# Patient Record
Sex: Male | Born: 1935 | Race: White | Hispanic: No | Marital: Married | Smoking: Former smoker
Health system: Southern US, Community
[De-identification: ages and names within clinical notes are randomized; demographics above are authoritative.]

## PROBLEM LIST (undated history)

## (undated) DIAGNOSIS — C801 Malignant (primary) neoplasm, unspecified: Secondary | ICD-10-CM

## (undated) DIAGNOSIS — E441 Mild protein-calorie malnutrition: Secondary | ICD-10-CM

## (undated) DIAGNOSIS — R42 Dizziness and giddiness: Secondary | ICD-10-CM

## (undated) DIAGNOSIS — C4490 Unspecified malignant neoplasm of skin, unspecified: Secondary | ICD-10-CM

## (undated) DIAGNOSIS — I10 Essential (primary) hypertension: Secondary | ICD-10-CM

## (undated) DIAGNOSIS — N139 Obstructive and reflux uropathy, unspecified: Secondary | ICD-10-CM

## (undated) DIAGNOSIS — R001 Bradycardia, unspecified: Secondary | ICD-10-CM

## (undated) DIAGNOSIS — E785 Hyperlipidemia, unspecified: Secondary | ICD-10-CM

## (undated) DIAGNOSIS — N400 Enlarged prostate without lower urinary tract symptoms: Secondary | ICD-10-CM

## (undated) DIAGNOSIS — G519 Disorder of facial nerve, unspecified: Secondary | ICD-10-CM

## (undated) DIAGNOSIS — D630 Anemia in neoplastic disease: Secondary | ICD-10-CM

## (undated) DIAGNOSIS — R1310 Dysphagia, unspecified: Principal | ICD-10-CM

## (undated) DIAGNOSIS — Z923 Personal history of irradiation: Secondary | ICD-10-CM

## (undated) DIAGNOSIS — I429 Cardiomyopathy, unspecified: Secondary | ICD-10-CM

## (undated) DIAGNOSIS — Z9889 Other specified postprocedural states: Secondary | ICD-10-CM

## (undated) DIAGNOSIS — M1990 Unspecified osteoarthritis, unspecified site: Secondary | ICD-10-CM

## (undated) DIAGNOSIS — H269 Unspecified cataract: Secondary | ICD-10-CM

## (undated) HISTORY — DX: Malignant (primary) neoplasm, unspecified: C80.1

## (undated) HISTORY — DX: Obstructive and reflux uropathy, unspecified: N13.9

## (undated) HISTORY — DX: Bradycardia, unspecified: R00.1

## (undated) HISTORY — PX: WISDOM TOOTH EXTRACTION: SHX21

## (undated) HISTORY — PX: TONSILLECTOMY: SUR1361

## (undated) HISTORY — DX: Mild protein-calorie malnutrition: E44.1

## (undated) HISTORY — PX: COLONOSCOPY: SHX174

## (undated) HISTORY — DX: Disorder of facial nerve, unspecified: G51.9

## (undated) HISTORY — DX: Dysphagia, unspecified: R13.10

## (undated) HISTORY — DX: Hyperlipidemia, unspecified: E78.5

## (undated) HISTORY — DX: Dizziness and giddiness: R42

## (undated) HISTORY — DX: Essential (primary) hypertension: I10

## (undated) HISTORY — DX: Cardiomyopathy, unspecified: I42.9

## (undated) HISTORY — DX: Benign prostatic hyperplasia without lower urinary tract symptoms: N40.0

## (undated) HISTORY — DX: Personal history of irradiation: Z92.3

## (undated) HISTORY — DX: Anemia in neoplastic disease: D63.0

## (undated) HISTORY — PX: PROSTATE BIOPSY: SHX241

## (undated) HISTORY — PX: HERNIA REPAIR: SHX51

---

## 2011-12-29 HISTORY — PX: CARDIOVASCULAR STRESS TEST: SHX262

## 2012-08-10 ENCOUNTER — Encounter (INDEPENDENT_AMBULATORY_CARE_PROVIDER_SITE_OTHER): Payer: Self-pay | Admitting: Surgery

## 2012-08-10 ENCOUNTER — Ambulatory Visit (INDEPENDENT_AMBULATORY_CARE_PROVIDER_SITE_OTHER): Payer: Medicare Other | Admitting: Surgery

## 2012-08-10 VITALS — BP 120/82 | HR 72 | Temp 97.0°F | Resp 18 | Ht 70.0 in | Wt 187.0 lb

## 2012-08-10 DIAGNOSIS — R221 Localized swelling, mass and lump, neck: Secondary | ICD-10-CM

## 2012-08-10 DIAGNOSIS — R220 Localized swelling, mass and lump, head: Secondary | ICD-10-CM

## 2012-08-10 NOTE — Patient Instructions (Signed)
Call Dr. [name] and ask for referral to an ENT doctor. If they are unable to do this, call my office in the morning and I can make these arrangements.

## 2012-08-10 NOTE — Progress Notes (Signed)
Mr. Pruss came in today with a new mass in the posterior sternocleidomastoid chain. He felt about 5 days ago basically been present for much longer. He had a CT scan of no 5 today and came in with the CD but it has no report.  I reviewed it and could see a more discrete mass on the left side. I looked in his mouth and he has good dental repair and was unable to visualize any oral mucosal lesions. I felt in his mouth but again I could this was only a limited exam. Because of his gag reflex I was unable to palpate his tonsillar pillar.  I think he would be best evaluated and managed by an ENT doctor. I asked him to call Dr. Jarold Motto in the morning and try to make that arrangement. If they're unable to hold and be glad to call someone myself.  Impression new firm nontender mass in the left posterior sternocleidomastoid chain. Recommendation: Sent to ENT for evaluation. No charge to the patient

## 2012-08-12 ENCOUNTER — Other Ambulatory Visit (HOSPITAL_COMMUNITY)
Admission: RE | Admit: 2012-08-12 | Discharge: 2012-08-12 | Disposition: A | Discharge status: Home / Self Care | Payer: Medicare Other | Source: Ambulatory Visit | Attending: Otolaryngology | Admitting: Otolaryngology

## 2012-08-12 ENCOUNTER — Other Ambulatory Visit: Payer: Self-pay | Admitting: Otolaryngology

## 2012-08-12 DIAGNOSIS — R220 Localized swelling, mass and lump, head: Secondary | ICD-10-CM | POA: Insufficient documentation

## 2012-08-15 DIAGNOSIS — C801 Malignant (primary) neoplasm, unspecified: Secondary | ICD-10-CM | POA: Insufficient documentation

## 2012-08-15 HISTORY — DX: Malignant (primary) neoplasm, unspecified: C80.1

## 2012-08-17 ENCOUNTER — Other Ambulatory Visit: Payer: Self-pay | Admitting: Otolaryngology

## 2012-08-17 DIAGNOSIS — IMO0002 Reserved for concepts with insufficient information to code with codable children: Secondary | ICD-10-CM

## 2012-08-25 ENCOUNTER — Encounter (HOSPITAL_COMMUNITY)
Admission: RE | Admit: 2012-08-25 | Discharge: 2012-08-25 | Disposition: A | Discharge status: Home / Self Care | Payer: Medicare Other | Source: Ambulatory Visit | Attending: Otolaryngology | Admitting: Otolaryngology

## 2012-08-25 ENCOUNTER — Encounter (HOSPITAL_COMMUNITY): Payer: Self-pay

## 2012-08-25 DIAGNOSIS — N433 Hydrocele, unspecified: Secondary | ICD-10-CM | POA: Insufficient documentation

## 2012-08-25 DIAGNOSIS — I700 Atherosclerosis of aorta: Secondary | ICD-10-CM | POA: Insufficient documentation

## 2012-08-25 DIAGNOSIS — I2510 Atherosclerotic heart disease of native coronary artery without angina pectoris: Secondary | ICD-10-CM | POA: Insufficient documentation

## 2012-08-25 DIAGNOSIS — K5730 Diverticulosis of large intestine without perforation or abscess without bleeding: Secondary | ICD-10-CM | POA: Insufficient documentation

## 2012-08-25 DIAGNOSIS — IMO0002 Reserved for concepts with insufficient information to code with codable children: Secondary | ICD-10-CM

## 2012-08-25 DIAGNOSIS — N289 Disorder of kidney and ureter, unspecified: Secondary | ICD-10-CM | POA: Insufficient documentation

## 2012-08-25 DIAGNOSIS — C801 Malignant (primary) neoplasm, unspecified: Secondary | ICD-10-CM | POA: Insufficient documentation

## 2012-08-25 DIAGNOSIS — N400 Enlarged prostate without lower urinary tract symptoms: Secondary | ICD-10-CM | POA: Insufficient documentation

## 2012-08-25 LAB — GLUCOSE, CAPILLARY: Glucose-Capillary: 104 mg/dL — ABNORMAL HIGH (ref 70–99)

## 2012-08-25 MED ORDER — FLUDEOXYGLUCOSE F - 18 (FDG) INJECTION
17.3000 | Freq: Once | INTRAVENOUS | Status: AC | PRN
  Administered 2012-08-25: 17.3 via INTRAVENOUS

## 2012-08-25 MED ORDER — FLUDEOXYGLUCOSE F - 18 (FDG) INJECTION: 1129.0000 | Freq: Once | INTRAVENOUS | Status: DC | PRN

## 2012-09-13 ENCOUNTER — Ambulatory Visit (HOSPITAL_COMMUNITY): Payer: Medicare Other | Attending: Cardiovascular Disease | Admitting: Radiology

## 2012-09-13 VITALS — BP 148/80 | HR 74 | Ht 70.0 in | Wt 184.0 lb

## 2012-09-13 DIAGNOSIS — I4949 Other premature depolarization: Secondary | ICD-10-CM

## 2012-09-13 DIAGNOSIS — I10 Essential (primary) hypertension: Secondary | ICD-10-CM | POA: Insufficient documentation

## 2012-09-13 DIAGNOSIS — I7090 Unspecified atherosclerosis: Secondary | ICD-10-CM

## 2012-09-13 DIAGNOSIS — R9439 Abnormal result of other cardiovascular function study: Secondary | ICD-10-CM

## 2012-09-13 DIAGNOSIS — Z01810 Encounter for preprocedural cardiovascular examination: Secondary | ICD-10-CM | POA: Insufficient documentation

## 2012-09-13 MED ORDER — TECHNETIUM TC 99M SESTAMIBI GENERIC - CARDIOLITE
10.0000 | Freq: Once | INTRAVENOUS | Status: AC | PRN
  Administered 2012-09-13: 10 via INTRAVENOUS

## 2012-09-13 MED ORDER — TECHNETIUM TC 99M SESTAMIBI GENERIC - CARDIOLITE
30.0000 | Freq: Once | INTRAVENOUS | Status: AC | PRN
  Administered 2012-09-13: 30 via INTRAVENOUS

## 2012-09-13 NOTE — Progress Notes (Signed)
[facility] SITE 3 NUCLEAR MED [address] [place] [address] [phone]  Cardiology Nuclear Med Study  Cory Serrano is a 77 y.o. male     MRN : [id number]     DOB: 1935/08/23  Procedure Date: 09/13/2012  Nuclear Med Background Indication for Stress Test:  Evaluation for Ischemia, Abnormal NM Pet Scan and  Pending Clearance for Neck Surgery by Dr. [name] History:  08/17/12 NM PET Scan:Atherosclerosis Cardiac Risk Factors: Family History - CAD, History of Smoking, Hypertension and Lipids  Symptoms:  No cardiac symptoms.   Nuclear Pre-Procedure Caffeine/Decaff Intake:  None > 12 hrs NPO After: 8:00pm   Lungs:  Clear. O2 Sat: 98% on room air. IV 0.9% NS with Angio Cath:  22g  IV Site: L Hand x 1, tolerated well IV Started by:  Irean Hong, RN  Chest Size (in):  42 Cup Size: n/a  Height: 5\' 10"  (1.778 m)  Weight:  184 lb (83.462 kg)  BMI:  Body mass index is 26.40 kg/(m^2). Tech Comments:  Atenolol held x 48 hours    Nuclear Med Study 1 or 2 day study: 1 day  Stress Test Type:  Stress  Reading MD: Charlton Haws, MD  Order Authorizing Provider:  Jarome Matin, MD, and [name], MD  Resting Radionuclide: Technetium 72m Sestamibi  Resting Radionuclide Dose: 11.0 mCi   Stress Radionuclide:  Technetium 34m Sestamibi  Stress Radionuclide Dose: 33.0 mCi           Stress Protocol Rest HR: 74 Stress HR: 141  Rest BP: 148/80 Stress BP: 226/83  Exercise Time (min): 4:45 METS: 6.7   Predicted Max HR: 146 bpm % Max HR: 96.58 bpm Rate Pressure Product: 69629   Dose of Adenosine (mg):  n/a Dose of Lexiscan: n/a mg  Dose of Atropine (mg): n/a Dose of Dobutamine: n/a mcg/kg/min (at max HR)  Stress Test Technologist: Smiley Houseman, CMA-N  Nuclear Technologist:  Domenic Polite, CNMT     Rest Procedure:  Myocardial perfusion imaging was performed at rest 45 minutes following the intravenous administration of Technetium 78m Sestamibi.  Rest ECG: No  acute changes, no previous EKG's for comparison.  Stress Procedure:  The patient exercised on the treadmill utilizing the Bruce protocol for 4:45 minutes. He then stopped due to fatigue and denied any chest pain.  There were ST-T wave changes with occasional PVC's/PAC's.  He had a hypertensive response to exercise, 226/83.  Technetium 60m Sestamibi was injected at peak exercise and myocardial perfusion imaging was performed after a brief delay.  Stress ECG: No significant change from baseline ECG  QPS Raw Data Images:  Normal; no motion artifact; normal heart/lung ratio. Stress Images:  Normal homogeneous uptake in all areas of the myocardium. Rest Images:  Normal homogeneous uptake in all areas of the myocardium. Subtraction (SDS):  Normal Transient Ischemic Dilatation (Normal <1.22):  0.92 Lung/Heart Ratio (Normal <0.45):  0.34  Quantitative Gated Spect Images QGS EDV:  62 ml QGS ESV:  20 ml  Impression Exercise Capacity:  Poor exercise capacity. BP Response:  Hypertensive blood pressure response. Clinical Symptoms:  No significant symptoms noted. ECG Impression:  No significant ST segment change suggestive of ischemia. Comparison with Prior Nuclear Study: No previous nuclear study performed  Overall Impression:  Normal stress nuclear study. Significant hypertension with exercise  LV Ejection Fraction: 68%.  LV Wall Motion:  NL LV Function; NL Wall Motion    Charlton Haws

## 2012-09-15 ENCOUNTER — Other Ambulatory Visit: Payer: Self-pay | Admitting: Otolaryngology

## 2012-09-20 ENCOUNTER — Encounter (HOSPITAL_COMMUNITY): Payer: Self-pay | Admitting: Pharmacy Technician

## 2012-09-23 ENCOUNTER — Other Ambulatory Visit: Payer: Self-pay | Admitting: Otolaryngology

## 2012-09-23 ENCOUNTER — Encounter (HOSPITAL_COMMUNITY)
Admission: RE | Admit: 2012-09-23 | Discharge: 2012-09-23 | Disposition: A | Discharge status: Home / Self Care | Payer: Medicare Other | Source: Ambulatory Visit | Attending: Otolaryngology | Admitting: Otolaryngology

## 2012-09-23 ENCOUNTER — Encounter (HOSPITAL_COMMUNITY): Payer: Self-pay

## 2012-09-23 HISTORY — DX: Unspecified osteoarthritis, unspecified site: M19.90

## 2012-09-23 LAB — BASIC METABOLIC PANEL
BUN: 16 mg/dL (ref 6–23)
CO2: 29 mEq/L (ref 19–32)
Calcium: 9.9 mg/dL (ref 8.4–10.5)
Chloride: 100 mEq/L (ref 96–112)
Creatinine, Ser: 0.85 mg/dL (ref 0.50–1.35)
Glucose, Bld: 102 mg/dL — ABNORMAL HIGH (ref 70–99)

## 2012-09-23 LAB — CBC
HCT: 40.1 % (ref 39.0–52.0)
MCH: 32.7 pg (ref 26.0–34.0)
MCHC: 35.2 g/dL (ref 30.0–36.0)
MCV: 93 fL (ref 78.0–100.0)
Platelets: 154 10*3/uL (ref 150–400)
RDW: 13.3 % (ref 11.5–15.5)
WBC: 6.4 10*3/uL (ref 4.0–10.5)

## 2012-09-23 NOTE — Pre-Procedure Instructions (Signed)
20 Brant Peets  09/23/2012   Your procedure is scheduled on:  09/29/12   Report to [facility] at 06:30 AM.  Call this number if you have problems the morning of surgery: [phone]   Remember:   Do not eat food:After Midnight.  Clear liquids include soda, tea, black coffee, apple or grape juice, broth.  Take these medicines the morning of surgery with A SIP OF WATER: ATENOLOL, stop fish oil, aspirin, and multivitamins 5 days prior to surgery    Do not wear jewelry, make-up or nail polish.  Do not wear lotions, powders, or perfumes. You may wear deodorant.  Do not shave 48 hours prior to surgery. Men may shave face and neck.  Do not bring valuables to the hospital.  Contacts, dentures or bridgework may not be worn into surgery.  Leave suitcase in the car. After surgery it may be brought to your room.  For patients admitted to the hospital, checkout time is 11:00 AM the day of discharge.   Patients discharged the day of surgery will not be allowed to drive home.  Name and phone number of your driver: N/A  Special Instructions: Shower using CHG 2 nights before surgery and the night before surgery.  If you shower the day of surgery use CHG.  Use special wash - you have one bottle of CHG for all showers.  You should use approximately 1/3 of the bottle for each shower.   Please read over the following fact sheets that you were given: Pain Booklet, Coughing and Deep Breathing and Surgical Site Infection Prevention, CHG Information sheet

## 2012-09-23 NOTE — Progress Notes (Signed)
Patient sees Dr. Jarold Motto with [facility] for primary care. Stress Test from 2013 in EPIC.

## 2012-09-23 NOTE — Progress Notes (Signed)
Called Dr. [name] office for orders. Spoke with Liz Beach, Dr. [name] nurse.

## 2012-09-26 ENCOUNTER — Encounter (HOSPITAL_COMMUNITY): Payer: Self-pay | Admitting: Vascular Surgery

## 2012-09-26 NOTE — Consult Note (Signed)
Anesthesia chart review: Patient is a 77 year old male posted for left radical neck dissection and a direct laryngoscopy for SCC by Dr. [name] on 09/29/2012.  Other history includes former smoker, hypertension, hyperlipidemia, arthritis.  PCP is Dr. Jarold Motto with [facility].    EKG on 09/23/12 showed sinus bradycardia with first-degree AV block.  He had a nuclear stress test on 09/13/12 (under Results Review tab) that showed: Normal stress nuclear study. Significant hypertension with exercise LV Ejection Fraction: 68%. LV Wall Motion: NL LV Function; NL Wall Motion.  CXR on 09/23/12 showed no active cardiopulmonary disease.   Labs noted.  Anticipate he can proceed as planned.  Shonna Chock, PA-C

## 2012-09-29 ENCOUNTER — Encounter (HOSPITAL_COMMUNITY)
Admission: RE | Disposition: A | Discharge status: Home / Self Care | Payer: Self-pay | Source: Ambulatory Visit | Attending: Otolaryngology

## 2012-09-29 ENCOUNTER — Encounter (HOSPITAL_COMMUNITY): Payer: Self-pay | Admitting: *Deleted

## 2012-09-29 ENCOUNTER — Ambulatory Visit (HOSPITAL_COMMUNITY)
Admission: RE | Admit: 2012-09-29 | Discharge: 2012-09-29 | Disposition: A | Discharge status: Home / Self Care | Payer: Medicare Other | Source: Ambulatory Visit | Attending: Otolaryngology | Admitting: Otolaryngology

## 2012-09-29 DIAGNOSIS — Z01818 Encounter for other preprocedural examination: Secondary | ICD-10-CM | POA: Insufficient documentation

## 2012-09-29 DIAGNOSIS — Z01810 Encounter for preprocedural cardiovascular examination: Secondary | ICD-10-CM | POA: Insufficient documentation

## 2012-09-29 DIAGNOSIS — I10 Essential (primary) hypertension: Secondary | ICD-10-CM | POA: Insufficient documentation

## 2012-09-29 DIAGNOSIS — Z01812 Encounter for preprocedural laboratory examination: Secondary | ICD-10-CM | POA: Insufficient documentation

## 2012-09-29 DIAGNOSIS — Z85828 Personal history of other malignant neoplasm of skin: Secondary | ICD-10-CM | POA: Insufficient documentation

## 2012-09-29 DIAGNOSIS — C770 Secondary and unspecified malignant neoplasm of lymph nodes of head, face and neck: Secondary | ICD-10-CM | POA: Insufficient documentation

## 2012-09-29 DIAGNOSIS — Z538 Procedure and treatment not carried out for other reasons: Secondary | ICD-10-CM | POA: Insufficient documentation

## 2012-09-29 SURGERY — DISSECTION, NECK, RADICAL
Anesthesia: General

## 2012-09-29 SURGICAL SUPPLY — 58 items
ATTRACTOMAT 16X20 MAGNETIC DRP (DRAPES) IMPLANT
BLADE SURG 15 STRL LF DISP TIS (BLADE) IMPLANT
BLADE SURG 15 STRL SS (BLADE)
CANISTER SUCTION 2500CC (MISCELLANEOUS) ×3 IMPLANT
CLEANER TIP ELECTROSURG 2X2 (MISCELLANEOUS) ×3 IMPLANT
CLOTH BEACON ORANGE TIMEOUT ST (SAFETY) ×3 IMPLANT
CONT SPEC 4OZ CLIKSEAL STRL BL (MISCELLANEOUS) ×3 IMPLANT
COVER SURGICAL LIGHT HANDLE (MISCELLANEOUS) ×3 IMPLANT
COVER TABLE BACK 60X90 (DRAPES) ×3 IMPLANT
CRADLE DONUT ADULT HEAD (MISCELLANEOUS) IMPLANT
DECANTER SPIKE VIAL GLASS SM (MISCELLANEOUS) ×3 IMPLANT
DRAIN CHANNEL 15F RND FF W/TCR (WOUND CARE) IMPLANT
DRAIN HEMOVAC 1/8 X 5 (WOUND CARE) IMPLANT
DRAPE PROXIMA HALF (DRAPES) ×3 IMPLANT
ELECT COATED BLADE 2.86 ST (ELECTRODE) ×3 IMPLANT
ELECT REM PT RETURN 9FT ADLT (ELECTROSURGICAL) ×2
ELECTRODE REM PT RTRN 9FT ADLT (ELECTROSURGICAL) ×2 IMPLANT
EVACUATOR SILICONE 100CC (DRAIN) ×3 IMPLANT
GAUZE SPONGE 4X4 16PLY XRAY LF (GAUZE/BANDAGES/DRESSINGS) IMPLANT
GLOVE SS BIOGEL STRL SZ 7.5 (GLOVE) ×4 IMPLANT
GLOVE SUPERSENSE BIOGEL SZ 7.5 (GLOVE) ×2
GOWN STRL NON-REIN LRG LVL3 (GOWN DISPOSABLE) ×6 IMPLANT
GUARD TEETH (MISCELLANEOUS) IMPLANT
KIT BASIN OR (CUSTOM PROCEDURE TRAY) ×3 IMPLANT
KIT ROOM TURNOVER OR (KITS) ×3 IMPLANT
LOCATOR NERVE 3 VOLT (DISPOSABLE) IMPLANT
MARKER SKIN DUAL TIP RULER LAB (MISCELLANEOUS) IMPLANT
NS IRRIG 1000ML POUR BTL (IV SOLUTION) ×3 IMPLANT
PAD ARMBOARD 7.5X6 YLW CONV (MISCELLANEOUS) ×6 IMPLANT
PATTIES SURGICAL .5 X3 (DISPOSABLE) IMPLANT
PENCIL FOOT CONTROL (ELECTRODE) ×3 IMPLANT
SOLUTION ANTI FOG 6CC (MISCELLANEOUS) IMPLANT
SPECIMEN JAR MEDIUM (MISCELLANEOUS) ×3 IMPLANT
SPECIMEN JAR SMALL (MISCELLANEOUS) ×3 IMPLANT
SPONGE GAUZE 4X4 12PLY (GAUZE/BANDAGES/DRESSINGS) ×3 IMPLANT
SPONGE INTESTINAL PEANUT (DISPOSABLE) IMPLANT
SPONGE LAP 18X18 X RAY DECT (DISPOSABLE) ×3 IMPLANT
SPONGE TONSIL 1 RF SGL (DISPOSABLE) ×3 IMPLANT
STAPLER VISISTAT 35W (STAPLE) ×3 IMPLANT
SURGILUBE 2OZ TUBE FLIPTOP (MISCELLANEOUS) ×3 IMPLANT
SUT CHROMIC 3 0 SH 27 (SUTURE) ×9 IMPLANT
SUT CHROMIC 5 0 P 3 (SUTURE) IMPLANT
SUT ETHILON 5 0 PS 2 18 (SUTURE) IMPLANT
SUT SILK 2 0 (SUTURE) ×2
SUT SILK 2 0 SH CR/8 (SUTURE) ×3 IMPLANT
SUT SILK 2-0 18XBRD TIE 12 (SUTURE) ×2 IMPLANT
SUT SILK 4 0 (SUTURE) ×8
SUT SILK 4-0 18XBRD TIE 12 (SUTURE) ×8 IMPLANT
SUT VIC AB 3-0 FS2 27 (SUTURE) IMPLANT
SUT VIC AB 3-0 SH 18 (SUTURE) IMPLANT
TOWEL OR 17X24 6PK STRL BLUE (TOWEL DISPOSABLE) ×6 IMPLANT
TOWEL OR 17X26 10 PK STRL BLUE (TOWEL DISPOSABLE) ×3 IMPLANT
TRAP SPECIMEN MUCOUS 40CC (MISCELLANEOUS) IMPLANT
TRAY ENT MC OR (CUSTOM PROCEDURE TRAY) ×3 IMPLANT
TRAY FOLEY CATH 14FRSI W/METER (CATHETERS) IMPLANT
TUBE CONNECTING 12X1/4 (SUCTIONS) ×3 IMPLANT
TUBE FEEDING 10FR FLEXIFLO (MISCELLANEOUS) IMPLANT
WATER STERILE IRR 1000ML POUR (IV SOLUTION) ×3 IMPLANT

## 2012-09-29 NOTE — Progress Notes (Signed)
Pt received from holding. Pt stated he would be calling oncologist once arrival to home.  Also, he stated he would await for Dr. [name] office to call.  Pt received no anesthesia; therefore, no anesthesia discharge instructions given.  Pt discharged to home  Via ambulation w/ wife at side.

## 2012-09-29 NOTE — Progress Notes (Signed)
Dr. [name] notified that orders need to be signed.

## 2012-09-29 NOTE — H&P (Signed)
Cory Serrano is an 77 y.o. male.   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 Discussion has been made about treatment options and he wants to proceed with a limited neck dissection. He understands that this may not address all of the lymph nodes that could be involved. He initially only wanted the node itself removed. After long discussion he is agreeable to do a very neck dissection. Radiation treatments may be necessary afterwords. He's had a long history of skin cancers and this is thought to be the source of this node.  Past Medical History  Diagnosis Date  . Hyperlipidemia   . Hypertension   . Arthritis     Past Surgical History  Procedure Date  . Tonsillectomy   . Hernia repair 8-9 years ago  . Wisdom tooth extraction   . Cardiovascular stress test 2013    copy in EPIC  . Colonoscopy     History reviewed. No pertinent family history. Social History:  reports that he quit smoking about 45 years ago. He has never used smokeless tobacco. He reports that he drinks about .6 ounces of alcohol per week. He reports that he does not use illicit drugs.  Allergies: No Known Allergies  Medications Prior to Admission  Medication Sig Dispense Refill  . amLODipine-benazepril (LOTREL) 5-20 MG per capsule Take 1 tablet by mouth Daily.      [name] aspirin 81 MG tablet Take 81 mg by mouth daily.      [name] atenolol (TENORMIN) 50 MG tablet Take 50 mg by mouth daily.      . Coenzyme Q10 (CO Q 10) 100 MG CAPS Take 100 mg by mouth 2 (two) times daily.      . hydrochlorothiazide (HYDRODIURIL) 25 MG tablet Take 12.5 mg by mouth Daily.      . Multiple Vitamins-Minerals (MULTIVITAMIN PO) Take 1 tablet by mouth daily.       . Omega-3 Fatty Acids (FISH OIL) 1200 MG CAPS Take 2 capsules by mouth 2 (two) times daily.      . simvastatin (ZOCOR) 40 MG tablet Take 40 mg by mouth every evening.         No results found for this or any  previous visit (from the past 48 hour(s)). No results found.  Review of Systems  Constitutional: Negative.   HENT: Negative.   Eyes: Negative.   Respiratory: Negative.   Cardiovascular: Negative.   Skin: Negative.     Blood pressure 173/84, pulse 57, temperature 97.4 F (36.3 C), temperature source Oral, resp. rate 18, SpO2 98.00%. Physical Exam  Constitutional: He appears well-nourished.  HENT:  Nose: Nose normal.  Mouth/Throat: Oropharynx is clear and moist.  Eyes: Pupils are equal, round, and reactive to light.  Neck: Normal range of motion.  Cardiovascular: Normal rate.   Respiratory: Effort normal.     Assessment/Plan Left neck mass-He is  ready to proceed with left neck dissection and direct laryngoscopy  Cory Serrano 09/29/2012, 7:31 AM

## 2012-09-29 NOTE — Progress Notes (Signed)
Dr,. [name] with pt. In Holding, discussion  led to  Cancelled surgery. Order rec'd fr. Dr. [name].

## 2012-10-04 ENCOUNTER — Encounter: Payer: Self-pay | Admitting: Radiation Oncology

## 2012-10-05 ENCOUNTER — Encounter: Payer: Self-pay | Admitting: Radiation Oncology

## 2012-10-05 ENCOUNTER — Encounter: Payer: Self-pay | Admitting: *Deleted

## 2012-10-05 ENCOUNTER — Ambulatory Visit
Admission: RE | Admit: 2012-10-05 | Discharge: 2012-10-05 | Disposition: A | Discharge status: Home / Self Care | Payer: Medicare Other | Source: Ambulatory Visit | Attending: Radiation Oncology | Admitting: Radiation Oncology

## 2012-10-05 VITALS — BP 147/77 | HR 60 | Temp 97.6°F | Resp 20 | Wt 186.9 lb

## 2012-10-05 DIAGNOSIS — Z79899 Other long term (current) drug therapy: Secondary | ICD-10-CM | POA: Insufficient documentation

## 2012-10-05 DIAGNOSIS — Z510 Encounter for antineoplastic radiation therapy: Secondary | ICD-10-CM | POA: Insufficient documentation

## 2012-10-05 DIAGNOSIS — Z87891 Personal history of nicotine dependence: Secondary | ICD-10-CM | POA: Insufficient documentation

## 2012-10-05 DIAGNOSIS — Z85828 Personal history of other malignant neoplasm of skin: Secondary | ICD-10-CM | POA: Insufficient documentation

## 2012-10-05 DIAGNOSIS — K117 Disturbances of salivary secretion: Secondary | ICD-10-CM | POA: Insufficient documentation

## 2012-10-05 DIAGNOSIS — Y842 Radiological procedure and radiotherapy as the cause of abnormal reaction of the patient, or of later complication, without mention of misadventure at the time of the procedure: Secondary | ICD-10-CM | POA: Insufficient documentation

## 2012-10-05 DIAGNOSIS — Z7982 Long term (current) use of aspirin: Secondary | ICD-10-CM | POA: Insufficient documentation

## 2012-10-05 DIAGNOSIS — C779 Secondary and unspecified malignant neoplasm of lymph node, unspecified: Secondary | ICD-10-CM | POA: Insufficient documentation

## 2012-10-05 DIAGNOSIS — R221 Localized swelling, mass and lump, neck: Secondary | ICD-10-CM

## 2012-10-05 DIAGNOSIS — I10 Essential (primary) hypertension: Secondary | ICD-10-CM | POA: Insufficient documentation

## 2012-10-05 DIAGNOSIS — L988 Other specified disorders of the skin and subcutaneous tissue: Secondary | ICD-10-CM | POA: Insufficient documentation

## 2012-10-05 DIAGNOSIS — R6884 Jaw pain: Secondary | ICD-10-CM | POA: Insufficient documentation

## 2012-10-05 DIAGNOSIS — R439 Unspecified disturbances of smell and taste: Secondary | ICD-10-CM | POA: Insufficient documentation

## 2012-10-05 DIAGNOSIS — J029 Acute pharyngitis, unspecified: Secondary | ICD-10-CM | POA: Insufficient documentation

## 2012-10-05 DIAGNOSIS — C801 Malignant (primary) neoplasm, unspecified: Secondary | ICD-10-CM

## 2012-10-05 DIAGNOSIS — C50919 Malignant neoplasm of unspecified site of unspecified female breast: Secondary | ICD-10-CM | POA: Insufficient documentation

## 2012-10-05 DIAGNOSIS — E785 Hyperlipidemia, unspecified: Secondary | ICD-10-CM | POA: Insufficient documentation

## 2012-10-05 HISTORY — DX: Unspecified malignant neoplasm of skin, unspecified: C44.90

## 2012-10-05 HISTORY — DX: Unspecified cataract: H26.9

## 2012-10-05 NOTE — Progress Notes (Signed)
Radiation Oncology         [phone] ________________________________  Initial outpatient Consultation  Name: Cory Serrano MRN: [id number]  Date: 10/05/2012  DOB: 06-Apr-1935  WU:[id number] Reece Agar, MD  [name], MD   REFERRING PHYSICIAN: [name], MD  DIAGNOSIS: The primary encounter diagnosis was Cancer. A diagnosis of Mass of left side of neck was also pertinent to this visit.  HISTORY OF PRESENT ILLNESS::Cory Serrano is a 77 y.o. male who is seen out of the courtesy of Dr. [name] for an opinion concerning radiation therapy as part of management of what appears to be metastatic squamous cell carcinoma of the skin.  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 According to patient he has had between 5 and 7 Moh's surgery is along the scalp region. In addition his most recent Moh's surgery was along the left preauricular area for a squamous cell carcinoma. This most recent surgery was formed in the spring of this year by Dr. Irene Limbo with [facility]. In late August of this year the patient noticed a swelling along his left upper posterior neck region.  He was seen by Dr. [name] and then Dr. Wenda Low  and Dr. [name].  The patient was actually scheduled for surgery for removal of this lymph node but actually backed out in the preoperative setting.  He wished a second opinion at that time.   PREVIOUS RADIATION THERAPY: No  PAST MEDICAL HISTORY:  has a past medical history of Hyperlipidemia; Hypertension; BPH (benign prostatic hyperplasia); Cardiomyopathy; Obstructive uropathy; Bradycardia; Cancer (08/15/12); Skin cancer; Arthritis; and Cataract.    PAST SURGICAL HISTORY: Past Surgical History  Procedure Date  . Tonsillectomy   . Hernia repair 8-9 years ago    right inguinal  . Wisdom tooth extraction   . Cardiovascular stress test 2013    copy in EPIC  . Colonoscopy   . Prostate biopsy        neg. 7-8 years ago    FAMILY HISTORY: family history includes Cancer in his mother.  SOCIAL HISTORY:  reports that he quit smoking about 45 years ago. His smoking use included Cigarettes. He has a 15 pack-year smoking history. He has never used smokeless tobacco. He reports that he drinks about .6 ounces of alcohol per week. He reports that he does not use illicit drugs.  ALLERGIES: Review of patient's allergies indicates no known allergies.  MEDICATIONS:  Current Outpatient Prescriptions  Medication Sig Dispense Refill  . amLODipine-benazepril (LOTREL) 5-20 MG per capsule Take 1 tablet by mouth Daily.      [name] aspirin 81 MG tablet Take 81 mg by mouth daily.      [name] atenolol (TENORMIN) 50 MG tablet Take 50 mg by mouth daily.      . Coenzyme Q10 (CO Q 10) 100 MG CAPS Take 100 mg by mouth 2 (two) times daily.      . hydrochlorothiazide (HYDRODIURIL) 25 MG tablet Take 12.5 mg by mouth Daily.      . Multiple Vitamins-Minerals (MULTIVITAMIN PO) Take 1 tablet by mouth daily.       . Omega-3 Fatty Acids (FISH OIL) 1200 MG CAPS Take 2 capsules by mouth 2 (two) times daily.      . simvastatin (ZOCOR) 40 MG tablet Take 40 mg by mouth every evening.         REVIEW OF SYSTEMS:  A 15 point review of systems is documented in the electronic  medical record. This was obtained by the nursing staff. However, I reviewed this with the patient to discuss relevant findings and make appropriate changes. He denies any pain with swallowing or swallowing difficulties. He denies any otalgia. He denies any pain along the left neck area where his enlarged lymph node is located. he has some numbness associated with the scalp areas where he has had multiple surgeries. He denies any numbness along the left neck or arm weakness.   PHYSICAL EXAM:  weight is 186 lb 14.4 oz (84.777 kg). His oral temperature is 97.6 F (36.4 C). His blood pressure is 147/77 and his pulse is 60. His respiration is 20.    General Appearance:     Alert, cooperative, no distress, appears stated age, somewhat nervous  Head:    Normocephalic, without obvious abnormality, atraumatic, there are multiple scars along the scalp region from prior skin cancer surgery, he has had between 5 and 7 Moh's surgeries in this area.  He also has a scar along the left preauricular area from his most recent Moh's surgery. This was in the spring of this year.   Eyes:    PERRL, conjunctiva/corneas clear, EOM's intact,        Ears:    Normal TM's and external ear canals, both ears  Nose:   Nares normal, septum midline, mucosa normal, no drainage    or sinus tenderness  Throat:   Lips, mucosa, and tongue normal; teeth and gums normal He would not permit a fiberoptic exam   Neck:   Supple, symmetrical, trachea midline, 3.5 x 3.5 cm lymph node in the left upper neck posterior to the sternocleidomastoid muscle, no other palpable adenopathy in the neck or supraclavicular region       thyroid:  No enlargement/tenderness/nodules; no carotid   bruit or JVD  Back:     Symmetric, no curvature, ROM normal, no CVA tenderness  Lungs:     Clear to auscultation bilaterally, respirations unlabored  Chest wall:    No tenderness or deformity  Heart:    Regular rate and rhythm, S1 and S2 normal, no murmur, rub   or gallop  Abdomen:     Soft, non-tender, bowel sounds active all four quadrants,    no masses, no organomegaly  Genitalia:   not done  Rectal:    Not done     Extremities:   Extremities normal, atraumatic, no cyanosis or edema  Pulses:   2+ and symmetric all extremities  Skin:   Skin color, texture, turgor normal, no rashes or lesions  Lymph nodes:   axillary nodes normal  Neurologic:   CNII-XII intact. Normal strength, sensation and reflexes      throughout    LABORATORY DATA:  Lab Results  Component Value Date   WBC 6.4 09/23/2012   HGB 14.1 09/23/2012   HCT 40.1 09/23/2012   MCV 93.0 09/23/2012   PLT 154 09/23/2012   Lab Results  Component Value Date   NA  137 09/23/2012   K 4.5 09/23/2012   CL 100 09/23/2012   CO2 29 09/23/2012   No results found for this basename: ALT, AST, GGT, ALKPHOS, BILITOT  PATHOLOGY  Patient Name: RUPERTO, KIERNAN Accession #: [id number] DOB: 1935-11-17 Age: 18 Gender: M Client Name [facility] Collected Date: 08/12/2012 Received Date: 08/15/2012 Physician: [name], MD Chart #: MRN # : [id number] Physician cc: Race:W Visit #: CYTOPATHOLOGY REPORT Adequacy Reason Satisfactory For Evaluation. Diagnosis NECK, FINE NEEDLE ASPIRATION, LEFT:  MALIGNANT CELLS PRESENT CONSISENT WITH SQUAMOUS CELL CARCINOMA. COMMENT: DR. Raynald Blend HAS REVIEWED THIS CASE AND AGREES. CALLED TO DR. BYERS' OFFICE ON 08/16/12. Jimmy Picket MD Pathologist, Electronic Signature (Case signed 08/16/2012) Source Neck, Fine Needle Aspiration, Left Gross Specimen: Received is/are 30cc's of cloudy, white cytolyt solution and 2 air dried slides for Diff stain.(PH:gw) Prepared: # Smears: 2 # Concentration Technique Slides (i.e. ThinPrep): 1 # Cell Block: 1 Cellient Additional Studies: N/A Report signed out from the following location(s) [facility] [address] [place], [place], [place] [address]. CLIA #: [id number], [facility] [address], [place], [place] [address]. CLIA #: [id number], 1 of 1   RADIOGRAPHY: Dg Chest 2 View  09/23/2012  *RADIOLOGY REPORT*  Clinical Data: Preop neck dissection.  Hypertension.  CHEST - 2 VIEW  Comparison: None.  Findings: Heart and mediastinal contours are within normal limits. No focal opacities or effusions.  No acute bony abnormality.  IMPRESSION: No active cardiopulmonary disease.   Original Report Authenticated By: Cyndie Chime, M.D.    PET SCAN:  IMPRESSION:  1. There is a solitary hypermetabolic level V-a lymph node on the  left in the neck, concerning for malignant nodal metastasis.  2. There is an additional hypermetabolic focus immediately    posterior to the mandible on the left, presumably secondary to  dental disease.  3. No other findings to suggest metastatic disease in the chest,  abdomen or pelvis.  4. Colonic diverticulosis without findings to suggest acute  diverticulitis at this time.  5. Atherosclerosis, including left main and two-vessel coronary  artery disease.  6. Additional incidental findings, as above.     IMPRESSION: Probable metastatic squamous cell carcinoma of the skin to the left posterior neck region. I would recommend however  that the patient undergo a thorough head and neck exam through Dr. [name] office to rule out a head and neck primary. I discussed options for management of his metastatic squamous cell carcinoma including neck dissection, definitive radiation therapy and combined treatment with surgery and postoperative radiation treatments. Given the size of the lesion my first preference would be for the patient to proceed with surgery and then to consider postop radiation treatments. Patient is somewhat hesitant in considering surgery at this time but will discuss with his wife and let me or Dr. [name] know on October 14 or 15th his final decision.  If the patient does proceed with radiation therapy I have recommended he proceed with pre-radiation dental evaluation.   ------------------------------------------------   [name], PhD, MD

## 2012-10-05 NOTE — Progress Notes (Signed)
Please see the Nurse Progress Note in the MD Initial Consult Encounter for this patient. 

## 2012-10-05 NOTE — Progress Notes (Signed)
New Consult 08/15/12 Biopsy Left neck=Squamous Cell Carcinoma  Married, adopted  Daughter, alert,oriented x3, no c/o pain, 8-10 squamous cell carcinoma and basal cells on top of forehead, left neck knot mass    Allergies: NKDA

## 2012-10-07 NOTE — Addendum Note (Signed)
Encounter addended by: Delynn Flavin, RN on: 10/07/2012  6:20 PM<BR>     Documentation filed: Charges VN

## 2012-10-13 ENCOUNTER — Ambulatory Visit (HOSPITAL_COMMUNITY): Payer: Self-pay | Admitting: Dentistry

## 2012-10-13 ENCOUNTER — Encounter (HOSPITAL_COMMUNITY): Payer: Self-pay | Admitting: Dentistry

## 2012-10-13 VITALS — BP 130/71 | HR 58 | Temp 98.0°F

## 2012-10-13 DIAGNOSIS — K036 Deposits [accretions] on teeth: Secondary | ICD-10-CM

## 2012-10-13 DIAGNOSIS — K011 Impacted teeth: Secondary | ICD-10-CM

## 2012-10-13 DIAGNOSIS — M264 Malocclusion, unspecified: Secondary | ICD-10-CM

## 2012-10-13 DIAGNOSIS — C760 Malignant neoplasm of head, face and neck: Secondary | ICD-10-CM

## 2012-10-13 DIAGNOSIS — C801 Malignant (primary) neoplasm, unspecified: Secondary | ICD-10-CM

## 2012-10-13 DIAGNOSIS — K0530 Chronic periodontitis, unspecified: Secondary | ICD-10-CM

## 2012-10-13 DIAGNOSIS — K089 Disorder of teeth and supporting structures, unspecified: Secondary | ICD-10-CM

## 2012-10-13 DIAGNOSIS — M270 Developmental disorders of jaws: Secondary | ICD-10-CM

## 2012-10-13 DIAGNOSIS — Z0189 Encounter for other specified special examinations: Secondary | ICD-10-CM

## 2012-10-13 DIAGNOSIS — J392 Other diseases of pharynx: Secondary | ICD-10-CM

## 2012-10-13 MED ORDER — SODIUM FLUORIDE 1.1 % DT CREA: TOPICAL_CREAM | DENTAL | Status: DC

## 2012-10-13 NOTE — Progress Notes (Signed)
DENTAL CONSULTATION  Date of Consultation:  10/13/2012 Patient Name:   Cory Serrano Date of Birth:   [birth year] Medical Record Number: [id number]  VITALS: BP 130/71  Pulse 58  Temp 98 F (36.7 C) (Oral)   CHIEF COMPLAINT: Patient referred for a preradiation therapy dental protocol evaluation.  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 Patient with anticipated radiation therapy. Patient now seen as part of a preradiation therapy dental protocol evaluation.  Patient currently denies acute toothache, swellings, or abscesses. Patient is seen on an every 6 month basis. Patient sees Dr. Domingo Dimes as his primary dentist.  He was last seen in May of 2013 for an exam and cleaning.  Patient Active Problem List  Diagnosis  . Mass of left side of neck  . Cancer    PMH: Past Medical History  Diagnosis Date  . Hyperlipidemia   . Hypertension   . BPH (benign prostatic hyperplasia)   . Cardiomyopathy   . Obstructive uropathy   . Bradycardia   . 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 Arthritis     hands per patient  . Cataract     beginning of    PSH: Past Surgical History  Procedure Date  . Tonsillectomy   . Hernia repair 8-9 years ago    right inguinal  . Wisdom tooth extraction   . Cardiovascular stress test 2013    copy in [facility]  . Colonoscopy   . Prostate biopsy      neg. 7-8 years ago    ALLERGIES: No Known Allergies  MEDICATIONS: Current Outpatient Prescriptions  Medication Sig Dispense Refill  . amLODipine-benazepril (LOTREL) 5-20 MG per capsule Take 1 tablet by mouth Daily.      [name] aspirin 81 MG tablet Take 81 mg by mouth daily.      [name] atenolol (TENORMIN) 50 MG tablet Take 50 mg by mouth daily.      . Coenzyme Q10 (CO Q 10) 100 MG CAPS Take 100 mg by mouth 2 (two) times daily.      . hydrochlorothiazide  (HYDRODIURIL) 25 MG tablet Take 12.5 mg by mouth Daily.      . Multiple Vitamins-Minerals (MULTIVITAMIN PO) Take 1 tablet by mouth daily.       . Omega-3 Fatty Acids (FISH OIL) 1200 MG CAPS Take 2 capsules by mouth 2 (two) times daily.      . simvastatin (ZOCOR) 40 MG tablet Take 40 mg by mouth every evening.       . sodium fluoride (PREVIDENT 5000 PLUS) 1.1 % CREA dental cream Apply thin ribbon of cream to tooth brush. Brush teeth for 2 minutes. Spit out excess-DO NOT swallow. DO NOT rinse afterwards. Repeat nightly.  1 Tube  PRN    LABS: Lab Results  Component Value Date   WBC 6.4 09/23/2012   HGB 14.1 09/23/2012   HCT 40.1 09/23/2012   MCV 93.0 09/23/2012   PLT 154 09/23/2012      Component Value Date/Time   NA 137 09/23/2012 1139   K 4.5 09/23/2012 1139   CL 100 09/23/2012 1139   CO2 29 09/23/2012 1139   GLUCOSE 102* 09/23/2012 1139   BUN 16 09/23/2012 1139   CREATININE 0.85 09/23/2012 1139   CALCIUM 9.9 09/23/2012 1139   GFRNONAA 84* 09/23/2012 1139  GFRAA >90 09/23/2012 1139   No results found for this basename: INR, PROTIME   No results found for this basename: PTT    SOCIAL HISTORY: History   Social History  . Marital Status: Married    Spouse Name: N/A    Number of Children: 1  . Years of Education: N/A   Occupational History  .    .      investment financial   Social History Main Topics  . Smoking status: Former Smoker -- 1.5 packs/day for 10 years    Types: Cigarettes    Quit date: 12/28/1966  . Smokeless tobacco: Never Used  . Alcohol Use: 0.6 oz/week    1 Shots of liquor per week     0.6 ounces week  . Drug Use: No  . Sexually Active: Not on file   Other Topics Concern  . Not on file   Social History Narrative  . No narrative on file    FAMILY HISTORY: Family History  Problem Relation Age of Onset  . Cancer Mother     colon     REVIEW OF SYSTEMS: Reviewed with patient and included in dental chart.  DENTAL HISTORY: CHIEF COMPLAINT: Patient  referred for a preradiation therapy dental protocol evaluation.  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 Patient with anticipated radiation therapy. Patient now seen as part of a preradiation therapy dental protocol evaluation.  Patient currently denies acute toothache, swellings, or abscesses. Patient is seen on an every 6 month basis. Patient sees Dr. Domingo Dimes as his primary dentist.  He was last seen in May of 2013 for an exam and cleaning.   DENTAL EXAMINATION:  GENERAL:. Patient is a well-developed, well-nourished male in no acute distress. HEAD AND NECK: There is left neck lymphadenopathy consistent with cancer diagnosis. There is no right neck lymphadenopathy. The patient denies acute TMJ symptoms. INTRAORAL EXAM: Patient has normal saliva. Patient has a mandibular right lingual torus. DENTITION: The patient is missing tooth numbers 16 and 32. Tooth #17 is a full bony impaction. PERIODONTAL: Patient with chronic periodontitis with minimal plaque accumulations, selective areas of gingival recession, and no obvious tooth mobility. There is incipient to moderate bone loss noted. DENTAL CARIES/SUBOPTIMAL RESTORATIONS: The distal margin of tooth #31 has an overhang. Multiple abfraction lesions are noted and could be restored with composite resin as indicated by primary dentist. ENDODONTIC: Patient currently denies acute pulpitis symptoms. Patient has had a root canal therapy of tooth #10 with no obvious persistent periapical pathology or symptoms. CROWN AND BRIDGE: The patient has multiple crown restorations.  The margins on crown #18 are less than ideal. PROSTHODONTIC: No history of partial dentures.. OCCLUSION: Patient has a deep overbite and less than ideal occlusion. The occlusion is stable however.  RADIOGRAPHIC INTERPRETATION: An orthopantogram was taken and supplemented with a full series of dental  radiographs. These are suboptimal due to the gagging nature of the patient. Patient is missing tooth numbers 16 and 32. Tooth #17 is a full bony impaction. There multiple dental restorations noted. There is a distal overhang on the restoration of tooth #31. There is incipient to moderate bone loss. Tooth #10 has a root canal therapy with no obvious persistent periapical pathology.  ASSESSMENTS: 1. Chronic periodontitis with plaque accumulations 2. Incipient bone loss 3. No significant tooth mobility 4. mandibular  right torus 5. Multiple missing teeth 6. Impacted tooth #17 7. Deep overbite 8. Multiple abfraction  lesions 9. Poor occlusal scheme but stable occlusion 10. Severe gag reflex   PLAN/RECOMMENDATIONS: 1. I discussed the risks, benefits, and complications of various treatment options with the patient in relationship to his medical and dental conditions, anticipated radiation therapy, and radiation therapy side effects to include xerostomia, radiation caries ,mucositis, taste changes, gum and jawbone changes, and risk for infection and osteoradionecrosis. We discussed various treatment options to include no treatment, multiple extractions with alveoloplasty of teeth in the primary field ration therapy, pre-prosthetic surgery as indicated, periodontal therapy, dental restorations, root canal therapy, crown and bridge therapy, implant therapy, and replacement of missing teeth as indicated. We also discussed fabrication of fluoride trays and scatter protection devices. The patient does not wish to proceed with dental extractions at this time and understands potential risk for osteoradionecrosis in the future. I also attempted impressions for the fabrication of fluoride trays and scatter protection devices. Patient has a severe gagging reflex and we were unable to obtain impressions today. Patient agreed to use fluoride therapy the the PreviDent 5000 Plus instead of fabrication of fluoride trays  at this time. Scatter protection devices therefore will not be fabricated for this patient. The patient was encouraged to set up a dental cleaning appointment with Dr. Providence Lanius prior to the start of radiation therapy and I will assist in coordination of this is best able.   2. Discussion of findings with medical team and coordination of future medical and dental care as indicated  .  Charlynne Pander, DDS

## 2012-10-13 NOTE — Patient Instructions (Addendum)

## 2012-10-18 ENCOUNTER — Ambulatory Visit
Admission: RE | Admit: 2012-10-18 | Discharge: 2012-10-18 | Disposition: A | Discharge status: Home / Self Care | Payer: Medicare Other | Source: Ambulatory Visit | Attending: Radiation Oncology | Admitting: Radiation Oncology

## 2012-10-18 ENCOUNTER — Ambulatory Visit: Admission: RE | Admit: 2012-10-18 | Payer: Medicare Other | Source: Ambulatory Visit

## 2012-10-18 VITALS — Wt 181.0 lb

## 2012-10-18 DIAGNOSIS — R221 Localized swelling, mass and lump, neck: Secondary | ICD-10-CM

## 2012-10-18 DIAGNOSIS — C801 Malignant (primary) neoplasm, unspecified: Secondary | ICD-10-CM

## 2012-10-18 DIAGNOSIS — C760 Malignant neoplasm of head, face and neck: Secondary | ICD-10-CM | POA: Insufficient documentation

## 2012-10-18 MED ORDER — SODIUM CHLORIDE 0.9 % IJ SOLN
10.0000 mL | Freq: Once | INTRAMUSCULAR | Status: AC
  Administered 2012-10-18: 10 mL via INTRAVENOUS

## 2012-10-18 NOTE — Progress Notes (Signed)
Patient gave dob and name as identification, not diabetic,not allergic to sulfa,iv dye stated patient, started IV LAC , #22g x 1 attempt, excellent blood return, flushed with 10cc normal saline,. BUN/CR  verified   By [facility] Malloy/RN,  Arlys John therapist escorted patient to Ct sim  3:01 PM

## 2012-10-19 NOTE — Progress Notes (Signed)
IV catheter removed from left arm antecubital area intact with no bleeding.Reviewed patient calendar, and left voice mail for Delice Bison Barrister's clerk counselor to reschedule appointment to see patient first week of treatment.

## 2012-10-19 NOTE — Progress Notes (Signed)
  Radiation Oncology         [phone] ________________________________  Name: Cory Serrano MRN: [id number]  Date: 10/18/2012  DOB: Feb 06, 1935  SIMULATION AND TREATMENT PLANNING NOTE  DIAGNOSIS:  Metastatic squamous cell carcinoma to the left neck  NARRATIVE:  The patient was brought to the CT Simulation planning suite.  Identity was confirmed.  All relevant records and images related to the planned course of therapy were reviewed.  The patient freely provided informed written consent to proceed with treatment after reviewing the details related to the planned course of therapy. The consent form was witnessed and verified by the simulation staff.  Then, the patient was set-up in a stable reproducible  supine position for radiation therapy.  CT images were obtained.  Surface markings were placed.  The CT images were loaded into the planning software.  Then the target and avoidance structures were contoured.  Treatment planning then occurred.  The radiation prescription was entered and confirmed.  A total of 1 complex treatment devices were fabricated. I have requested : Intensity Modulated Radiotherapy (IMRT) is medically necessary for this case for the following reason:  Parotid sparing..  I have ordered:dose calc.  PLAN:  The patient will receive 7000 cGy in 35 fractions.  ________________________________    [name], PhD, MD

## 2012-10-27 ENCOUNTER — Ambulatory Visit
Admission: RE | Admit: 2012-10-27 | Discharge: 2012-10-27 | Disposition: A | Discharge status: Home / Self Care | Payer: Medicare Other | Source: Ambulatory Visit | Attending: Radiation Oncology | Admitting: Radiation Oncology

## 2012-10-27 ENCOUNTER — Telehealth: Payer: Self-pay | Admitting: Radiation Oncology

## 2012-10-27 ENCOUNTER — Encounter: Payer: Self-pay | Admitting: Radiation Oncology

## 2012-10-27 DIAGNOSIS — C801 Malignant (primary) neoplasm, unspecified: Secondary | ICD-10-CM

## 2012-10-27 NOTE — Telephone Encounter (Signed)
Met with patient to discuss RO billing.  Dx: 784.2 Swelling, mass or lump in head and neck  Attending Rad: Dr. Roselind Messier  Rad Tx: IMRT x40

## 2012-10-27 NOTE — Progress Notes (Signed)
   Department of Radiation Oncology  Phone:  [phone] Fax:        [phone]   Intensity modulated radiation therapy treatment device note  Today the patient began treatment with helical intensity modulated radiation therapy.  The patient will be treated with 7.6 sinogram segments. This constitutes 1 IMRT device.  -----------------------------------  [name], PhD, MD

## 2012-10-28 ENCOUNTER — Ambulatory Visit
Admission: RE | Admit: 2012-10-28 | Discharge: 2012-10-28 | Disposition: A | Discharge status: Home / Self Care | Payer: Medicare Other | Source: Ambulatory Visit | Attending: Radiation Oncology | Admitting: Radiation Oncology

## 2012-10-31 ENCOUNTER — Ambulatory Visit
Admission: RE | Admit: 2012-10-31 | Discharge: 2012-10-31 | Disposition: A | Discharge status: Home / Self Care | Payer: Medicare Other | Source: Ambulatory Visit | Attending: Radiation Oncology | Admitting: Radiation Oncology

## 2012-11-01 ENCOUNTER — Ambulatory Visit
Admission: RE | Admit: 2012-11-01 | Discharge: 2012-11-01 | Disposition: A | Discharge status: Home / Self Care | Payer: Medicare Other | Source: Ambulatory Visit | Attending: Radiation Oncology | Admitting: Radiation Oncology

## 2012-11-01 VITALS — BP 149/89 | HR 68 | Temp 98.2°F | Wt 187.9 lb

## 2012-11-01 DIAGNOSIS — C801 Malignant (primary) neoplasm, unspecified: Secondary | ICD-10-CM

## 2012-11-01 DIAGNOSIS — R221 Localized swelling, mass and lump, neck: Secondary | ICD-10-CM

## 2012-11-01 MED ORDER — BIAFINE EX EMUL
CUTANEOUS | Status: DC | PRN
  Administered 2012-11-01: 1 via TOPICAL

## 2012-11-01 NOTE — Progress Notes (Signed)
[facility]    Radiation Oncology [address] [place]     [name], M.D. [place], [place] [address]               [name], M.D., Ph.D. Phone: [phone]      Molli Hazard A. Kathrynn Running, M.D. Fax: [phone]      Radene Gunning, M.D., Ph.D.         [name], M.D.         [name], M.D Weekly Treatment Management Note  Name: Cory Serrano     MRN: [id number]        CSN: [id number] Date: 11/01/2012      DOB: 1935-06-15  CC: Cory Fillers, MD         Cory Serrano    Status: Outpatient  Diagnosis: The encounter diagnosis was Cancer.  Current Dose: 800 cGy  Current Fraction: 4/35  Planned Dose: 7000 cGy  Narrative: Cory Serrano was seen today for weekly treatment management. The chart was checked and MVCT  were reviewed. He is tolerating his treatments well at this time without any side effects. He has had some problems with opening his jaw related to pain in the right jaw area. This is been a chronic intermittent problem for the patient.  Review of patient's allergies indicates no known allergies. Current Outpatient Prescriptions  Medication Sig Dispense Refill  . amLODipine-benazepril (LOTREL) 5-20 MG per capsule Take 1 tablet by mouth Daily.      [name] aspirin 81 MG tablet Take 81 mg by mouth daily.      [name] atenolol (TENORMIN) 50 MG tablet Take 50 mg by mouth daily.      . Coenzyme Q10 (CO Q 10) 100 MG CAPS Take 100 mg by mouth 2 (two) times daily.      . hydrochlorothiazide (HYDRODIURIL) 25 MG tablet Take 12.5 mg by mouth Daily.      . Multiple Vitamins-Minerals (MULTIVITAMIN PO) Take 1 tablet by mouth daily.       . Omega-3 Fatty Acids (FISH OIL) 1200 MG CAPS Take 2 capsules by mouth 2 (two) times daily.      . simvastatin (ZOCOR) 40 MG tablet Take 40 mg by mouth every evening.       . sodium fluoride (PREVIDENT 5000 PLUS) 1.1 % CREA dental cream Apply thin ribbon of cream to tooth brush. Brush teeth for 2 minutes. Spit out excess-DO NOT  swallow. DO NOT rinse afterwards. Repeat nightly.  1 Tube  PRN   Labs:  Lab Results  Component Value Date   WBC 6.4 09/23/2012   HGB 14.1 09/23/2012   HCT 40.1 09/23/2012   MCV 93.0 09/23/2012   PLT 154 09/23/2012   Lab Results  Component Value Date   CREATININE 0.85 09/23/2012   BUN 16 09/23/2012   NA 137 09/23/2012   K 4.5 09/23/2012   CL 100 09/23/2012   CO2 29 09/23/2012   No results found for this basename: ALT, AST, GGT, ALK, PHOS, BILITOT    Physical Examination:  weight is 187 lb 14.4 oz (85.231 kg). His temperature is 98.2 F (36.8 C). His blood pressure is 149/89 and his pulse is 68.    Wt Readings from Last 3 Encounters:  11/01/12 187 lb 14.4 oz (85.231 kg)  10/18/12 181 lb (82.101 kg)  10/05/12 186 lb 14.4 oz (84.777 kg)     Lungs - Normal respiratory effort, chest expands symmetrically. Lungs are clear to auscultation, no crackles or wheezes.  Heart has regular rhythm and rate  Abdomen is soft and non tender with normal bowel sounds The oral cavity is free of any infection or mucosal inflammation.  The patient's lymph node in the left posterior upper neck area measures 4 x 4 centimeters.  Assessment:  Patient tolerating treatments well  Plan: Continue treatment per original radiation prescription

## 2012-11-01 NOTE — Addendum Note (Signed)
Encounter addended by: Tessa Lerner, RN on: 11/01/2012  4:59 PM<BR>     Documentation filed: Inpatient MAR

## 2012-11-01 NOTE — Progress Notes (Signed)
Patient here for weekly under treat assessment of left neck radiation.Will complete treatment 2 today after seeing doctor.Patient education completed.Given biafine.Patient's main problem today is having pain on right side of face on opening mouth.Reviewed mouth care, skin care and how to take care of self to off set fatigue, also reviewed number of treatments, daily dose and cumulative dose of radiation.Understands he will be seen generally on Tuesday but on call doctor available if needed.

## 2012-11-02 ENCOUNTER — Ambulatory Visit
Admission: RE | Admit: 2012-11-02 | Discharge: 2012-11-02 | Disposition: A | Discharge status: Home / Self Care | Payer: Medicare Other | Source: Ambulatory Visit | Attending: Radiation Oncology | Admitting: Radiation Oncology

## 2012-11-03 ENCOUNTER — Ambulatory Visit
Admission: RE | Admit: 2012-11-03 | Discharge: 2012-11-03 | Disposition: A | Discharge status: Home / Self Care | Payer: Medicare Other | Source: Ambulatory Visit | Attending: Radiation Oncology | Admitting: Radiation Oncology

## 2012-11-04 ENCOUNTER — Ambulatory Visit
Admission: RE | Admit: 2012-11-04 | Discharge: 2012-11-04 | Disposition: A | Discharge status: Home / Self Care | Payer: Medicare Other | Source: Ambulatory Visit | Attending: Radiation Oncology | Admitting: Radiation Oncology

## 2012-11-07 ENCOUNTER — Ambulatory Visit
Admission: RE | Admit: 2012-11-07 | Discharge: 2012-11-07 | Disposition: A | Discharge status: Home / Self Care | Payer: Medicare Other | Source: Ambulatory Visit | Attending: Radiation Oncology | Admitting: Radiation Oncology

## 2012-11-08 ENCOUNTER — Ambulatory Visit
Admission: RE | Admit: 2012-11-08 | Discharge: 2012-11-08 | Disposition: A | Discharge status: Home / Self Care | Payer: Medicare Other | Source: Ambulatory Visit | Attending: Radiation Oncology | Admitting: Radiation Oncology

## 2012-11-08 VITALS — BP 143/78 | HR 54 | Temp 97.9°F | Wt 187.5 lb

## 2012-11-08 DIAGNOSIS — C801 Malignant (primary) neoplasm, unspecified: Secondary | ICD-10-CM

## 2012-11-08 NOTE — Progress Notes (Signed)
[facility]    Radiation Oncology [address] [place]     [name], M.D. [place], [place] [address]               [name], M.D., Ph.D. Phone: [phone]      Molli Hazard A. Kathrynn Running, M.D. Fax: [phone]      Radene Gunning, M.D., Ph.D.         [name], M.D.         [name], M.D Weekly Treatment Management Note  Name: Cory Serrano     MRN: [id number]        CSN: [id number] Date: 11/08/2012      DOB: April 14, 1935  CC: [name], MD         [name]    Status: Outpatient  Diagnosis: The encounter diagnosis was Cancer.  Current Dose: 1800 cgy  Current Fraction: 9/35  Planned Dose: 7000 cGy  Narrative: Cory Serrano was seen today for weekly treatment management. The chart was checked and MVCT  were reviewed. He has noticed some slight dryness to his mouth but no taste issues or swallowing difficulties.  He continues to have difficulty opening his mouth in light of right jaw pain.    Review of patient's allergies indicates no known allergies.  Current Outpatient Prescriptions  Medication Sig Dispense Refill  . amLODipine-benazepril (LOTREL) 5-20 MG per capsule Take 1 tablet by mouth Daily.      [name] aspirin 81 MG tablet Take 81 mg by mouth daily.      [name] atenolol (TENORMIN) 50 MG tablet Take 50 mg by mouth daily.      . Coenzyme Q10 (CO Q 10) 100 MG CAPS Take 100 mg by mouth 2 (two) times daily.      . hydrochlorothiazide (HYDRODIURIL) 25 MG tablet Take 12.5 mg by mouth Daily.      . Multiple Vitamins-Minerals (MULTIVITAMIN PO) Take 1 tablet by mouth daily.       . Omega-3 Fatty Acids (FISH OIL) 1200 MG CAPS Take 2 capsules by mouth 2 (two) times daily.      . simvastatin (ZOCOR) 40 MG tablet Take 40 mg by mouth every evening.       . sodium fluoride (PREVIDENT 5000 PLUS) 1.1 % CREA dental cream Apply thin ribbon of cream to tooth brush. Brush teeth for 2 minutes. Spit out excess-DO NOT swallow. DO NOT rinse afterwards. Repeat  nightly.  1 Tube  PRN   Labs:  Lab Results  Component Value Date   WBC 6.4 09/23/2012   HGB 14.1 09/23/2012   HCT 40.1 09/23/2012   MCV 93.0 09/23/2012   PLT 154 09/23/2012   Lab Results  Component Value Date   CREATININE 0.85 09/23/2012   BUN 16 09/23/2012   NA 137 09/23/2012   K 4.5 09/23/2012   CL 100 09/23/2012   CO2 29 09/23/2012   No results found for this basename: ALT, AST, GGT, ALK, PHOS, BILITOT    Physical Examination:  weight is 187 lb 8 oz (85.049 kg). His temperature is 97.9 F (36.6 C). His blood pressure is 143/78 and his pulse is 54.    Wt Readings from Last 3 Encounters:  11/08/12 187 lb 8 oz (85.049 kg)  11/01/12 187 lb 14.4 oz (85.231 kg)  10/18/12 181 lb (82.101 kg)     Lungs - Normal respiratory effort, chest expands symmetrically. Lungs are clear to auscultation, no crackles or wheezes.  Heart has regular rhythm and  rate  Abdomen is soft and non tender with normal bowel sounds The oral cavity is moist without secondary infection.  The lymph node mass in the left upper posterior neck area measures approximately 3.5 x 3.5 cm.  Assessment:  Patient tolerating treatments well  Plan: Continue treatment per original radiation prescription

## 2012-11-08 NOTE — Progress Notes (Signed)
Patient here for weekly under treat assessment of left neck cancer.Completed 9 of 35 treatments.Denies pain.Slight dry mouth.Uses biotene.

## 2012-11-09 ENCOUNTER — Ambulatory Visit
Admission: RE | Admit: 2012-11-09 | Discharge: 2012-11-09 | Disposition: A | Discharge status: Home / Self Care | Payer: Medicare Other | Source: Ambulatory Visit | Attending: Radiation Oncology | Admitting: Radiation Oncology

## 2012-11-09 MED ORDER — SODIUM CHLORIDE 0.9 % IJ SOLN: 10.0000 mL | Freq: Once | INTRAMUSCULAR | Status: AC

## 2012-11-09 MED ORDER — SODIUM CHLORIDE 0.9 % IJ SOLN: 10.0000 mL | Freq: Once | INTRAMUSCULAR | Status: DC

## 2012-11-09 NOTE — Addendum Note (Signed)
Encounter addended by: Delynn Flavin, RN on: 11/09/2012  6:58 PM<BR>     Documentation filed: Visit Diagnoses, Charges VN, Orders

## 2012-11-09 NOTE — Addendum Note (Signed)
Encounter addended by: Delynn Flavin, RN on: 11/09/2012  7:15 PM<BR>     Documentation filed: Demographics Visit

## 2012-11-09 NOTE — Addendum Note (Signed)
Encounter addended by: Delynn Flavin, RN on: 11/09/2012  7:03 PM<BR>     Documentation filed: Orders

## 2012-11-10 ENCOUNTER — Ambulatory Visit
Admission: RE | Admit: 2012-11-10 | Discharge: 2012-11-10 | Disposition: A | Discharge status: Home / Self Care | Payer: Medicare Other | Source: Ambulatory Visit | Attending: Radiation Oncology | Admitting: Radiation Oncology

## 2012-11-11 ENCOUNTER — Ambulatory Visit
Admission: RE | Admit: 2012-11-11 | Discharge: 2012-11-11 | Disposition: A | Discharge status: Home / Self Care | Payer: Medicare Other | Source: Ambulatory Visit | Attending: Radiation Oncology | Admitting: Radiation Oncology

## 2012-11-14 ENCOUNTER — Ambulatory Visit
Admission: RE | Admit: 2012-11-14 | Discharge: 2012-11-14 | Disposition: A | Discharge status: Home / Self Care | Payer: Medicare Other | Source: Ambulatory Visit | Attending: Radiation Oncology | Admitting: Radiation Oncology

## 2012-11-15 ENCOUNTER — Ambulatory Visit
Admission: RE | Admit: 2012-11-15 | Discharge: 2012-11-15 | Disposition: A | Discharge status: Home / Self Care | Payer: Medicare Other | Source: Ambulatory Visit | Attending: Radiation Oncology | Admitting: Radiation Oncology

## 2012-11-15 VITALS — BP 147/84 | HR 51 | Temp 98.6°F | Wt 186.7 lb

## 2012-11-15 DIAGNOSIS — C801 Malignant (primary) neoplasm, unspecified: Secondary | ICD-10-CM

## 2012-11-15 NOTE — Progress Notes (Signed)
Patient here for weekly under treat assessment of left neck radiation.Has completed 14 of 35 treatments.Skin mildly pink.Dry mouth and metallic taste but able to tolerate soft diet.No significant weight change.

## 2012-11-15 NOTE — Progress Notes (Signed)
[facility]    Radiation Oncology [address] [place]     Cory Serrano, M.D. [place], [place] [address]               [name], M.D., Ph.D. Phone: [phone]      Molli Hazard A. Kathrynn Running, M.D. Fax: [phone]      Radene Gunning, M.D., Ph.D.         [name], M.D.         [name], M.D Weekly Treatment Management Note  Name: Cory Serrano     MRN: [id number]        CSN: [id number] Date: 11/15/2012      DOB: 1935-02-16  CC: Cory Fillers, MD         Cory Serrano    Status: Outpatient  Diagnosis: The encounter diagnosis was Cancer.  Current Dose: 2800 cGy  Current Fraction: 14/35  Planned Dose: 7000 cGy  Narrative: Cory Serrano was seen today for weekly treatment management. The chart was checked and MVCT  were reviewed. He is developing a sore throat. This does take Advil or Aleve which is helpful. He is also doing mouth rinses with biotene. He is also noticed a metallic taste in his mouth. on examination the large left upper posterior neck mass has softened up on exam has not changed in size.    Review of patient's allergies indicates no known allergies. Current Outpatient Prescriptions  Medication Sig Dispense Refill  . amLODipine-benazepril (LOTREL) 5-20 MG per capsule Take 1 tablet by mouth Daily.      [name] aspirin 81 MG tablet Take 81 mg by mouth daily.      [name] atenolol (TENORMIN) 50 MG tablet Take 50 mg by mouth daily.      . Coenzyme Q10 (CO Q 10) 100 MG CAPS Take 100 mg by mouth 2 (two) times daily.      . hydrochlorothiazide (HYDRODIURIL) 25 MG tablet Take 12.5 mg by mouth Daily.      . Multiple Vitamins-Minerals (MULTIVITAMIN PO) Take 1 tablet by mouth daily.       . Omega-3 Fatty Acids (FISH OIL) 1200 MG CAPS Take 2 capsules by mouth 2 (two) times daily.      . simvastatin (ZOCOR) 40 MG tablet Take 40 mg by mouth every evening.       . sodium fluoride (PREVIDENT 5000 PLUS) 1.1 % CREA dental cream Apply thin ribbon of cream to tooth  brush. Brush teeth for 2 minutes. Spit out excess-DO NOT swallow. DO NOT rinse afterwards. Repeat nightly.  1 Tube  PRN   No current facility-administered medications for this encounter.   Facility-Administered Medications Ordered in Other Encounters  Medication Dose Route Frequency Provider Last Rate Last Dose  . sodium chloride 0.9 % injection 10 mL  10 mL Intravenous Once [name], MD       Labs:  Lab Results  Component Value Date   WBC 6.4 09/23/2012   HGB 14.1 09/23/2012   HCT 40.1 09/23/2012   MCV 93.0 09/23/2012   PLT 154 09/23/2012   Lab Results  Component Value Date   CREATININE 0.85 09/23/2012   BUN 16 09/23/2012   NA 137 09/23/2012   K 4.5 09/23/2012   CL 100 09/23/2012   CO2 29 09/23/2012   No results found for this basename: ALT, AST, GGT, ALK, PHOS, BILITOT    Physical Examination:  weight is 186 lb 11.2 oz (84.687 kg). His temperature is 98.6 F (37  C). His blood pressure is 147/84 and his pulse is 51.    Wt Readings from Last 3 Encounters:  11/15/12 186 lb 11.2 oz (84.687 kg)  11/08/12 187 lb 8 oz (85.049 kg)  11/01/12 187 lb 14.4 oz (85.231 kg)     Lungs - Normal respiratory effort, chest expands symmetrically. Lungs are clear to auscultation, no crackles or wheezes.  Heart has regular rhythm and rate  Abdomen is soft and non tender with normal bowel sounds on examination the large left upper posterior neck mass has softened up on exam but has not changed in size.   The oral cavity shows some erythema as well as the left posterior pharynx.   there is no secondary infection noted the oral cavity.  Assessment:  Patient tolerating treatments well except for issues as above.  Plan: Continue treatment per original radiation prescription

## 2012-11-16 ENCOUNTER — Ambulatory Visit
Admission: RE | Admit: 2012-11-16 | Discharge: 2012-11-16 | Disposition: A | Discharge status: Home / Self Care | Payer: Medicare Other | Source: Ambulatory Visit | Attending: Radiation Oncology | Admitting: Radiation Oncology

## 2012-11-17 ENCOUNTER — Ambulatory Visit
Admission: RE | Admit: 2012-11-17 | Discharge: 2012-11-17 | Disposition: A | Discharge status: Home / Self Care | Payer: Medicare Other | Source: Ambulatory Visit | Attending: Radiation Oncology | Admitting: Radiation Oncology

## 2012-11-18 ENCOUNTER — Ambulatory Visit
Admission: RE | Admit: 2012-11-18 | Discharge: 2012-11-18 | Disposition: A | Discharge status: Home / Self Care | Payer: Medicare Other | Source: Ambulatory Visit | Attending: Radiation Oncology | Admitting: Radiation Oncology

## 2012-11-19 ENCOUNTER — Ambulatory Visit
Admission: RE | Admit: 2012-11-19 | Discharge: 2012-11-19 | Disposition: A | Discharge status: Home / Self Care | Payer: Medicare Other | Source: Ambulatory Visit | Attending: Radiation Oncology | Admitting: Radiation Oncology

## 2012-11-21 ENCOUNTER — Ambulatory Visit
Admission: RE | Admit: 2012-11-21 | Discharge: 2012-11-21 | Disposition: A | Discharge status: Home / Self Care | Payer: Medicare Other | Source: Ambulatory Visit | Attending: Radiation Oncology | Admitting: Radiation Oncology

## 2012-11-22 ENCOUNTER — Ambulatory Visit
Admission: RE | Admit: 2012-11-22 | Discharge: 2012-11-22 | Disposition: A | Discharge status: Home / Self Care | Payer: Medicare Other | Source: Ambulatory Visit | Attending: Radiation Oncology | Admitting: Radiation Oncology

## 2012-11-22 VITALS — BP 140/75 | HR 56 | Temp 97.7°F | Wt 183.0 lb

## 2012-11-22 DIAGNOSIS — C801 Malignant (primary) neoplasm, unspecified: Secondary | ICD-10-CM

## 2012-11-22 NOTE — Progress Notes (Signed)
[facility]    Radiation Oncology [address] [place]     [name], M.D. [place], [place] [address]               [name], M.D., Ph.D. Phone: [phone]      Molli Hazard A. Kathrynn Running, M.D. Fax: [phone]      Radene Gunning, M.D., Ph.D.         [name], M.D.         [name], M.D Weekly Treatment Management Note  Name: Cory Serrano     MRN: [id number]        CSN: [id number] Date: 11/22/2012      DOB: [birth year]  CC: Cory Fillers, MD         Cory Serrano    Status: Outpatient  Diagnosis: The encounter diagnosis was Cancer.  Current Dose: 4000 cGy  Current Fraction: 20/35  Planned Dose: 7000 cGy  Narrative: Cory Serrano was seen today for weekly treatment management. The chart was checked and MVCT  were reviewed. He does have a sore throat which makes eating difficult. He however is not taking any significant pain medication at this time. He does feel his throat feels better this week compared to last week.  He does have a metallic taste any food  so eating is not enjoyable at this time.    Review of patient's allergies indicates no known allergies.  Current Outpatient Prescriptions  Medication Sig Dispense Refill  . amLODipine-benazepril (LOTREL) 5-20 MG per capsule Take 1 tablet by mouth Daily.      [name] aspirin 81 MG tablet Take 81 mg by mouth daily.      [name] atenolol (TENORMIN) 50 MG tablet Take 50 mg by mouth daily.      . Coenzyme Q10 (CO Q 10) 100 MG CAPS Take 100 mg by mouth 2 (two) times daily.      . hydrochlorothiazide (HYDRODIURIL) 25 MG tablet Take 12.5 mg by mouth Daily.      . Multiple Vitamins-Minerals (MULTIVITAMIN PO) Take 1 tablet by mouth daily.       . Omega-3 Fatty Acids (FISH OIL) 1200 MG CAPS Take 2 capsules by mouth 2 (two) times daily.      . simvastatin (ZOCOR) 40 MG tablet Take 40 mg by mouth every evening.       . sodium fluoride (PREVIDENT 5000 PLUS) 1.1 % CREA dental cream Apply thin ribbon of cream to  tooth brush. Brush teeth for 2 minutes. Spit out excess-DO NOT swallow. DO NOT rinse afterwards. Repeat nightly.  1 Tube  PRN  . desoximetasone (TOPICORT) 0.25 % cream        No current facility-administered medications for this encounter.   Facility-Administered Medications Ordered in Other Encounters  Medication Dose Route Frequency Provider Last Rate Last Dose  . sodium chloride 0.9 % injection 10 mL  10 mL Intravenous Once [name], MD       Labs:  Lab Results  Component Value Date   WBC 6.4 09/23/2012   HGB 14.1 09/23/2012   HCT 40.1 09/23/2012   MCV 93.0 09/23/2012   PLT 154 09/23/2012   Lab Results  Component Value Date   CREATININE 0.85 09/23/2012   BUN 16 09/23/2012   NA 137 09/23/2012   K 4.5 09/23/2012   CL 100 09/23/2012   CO2 29 09/23/2012   No results found for this basename: ALT, AST, GGT, ALK, PHOS, BILITOT    Physical Examination:  weight is 183 lb (83.008 kg). His temperature is 97.7 F (36.5 C). His blood pressure is 140/75 and his pulse is 56. His oxygen saturation is 100%.    Wt Readings from Last 3 Encounters:  11/22/12 183 lb (83.008 kg)  11/15/12 186 lb 11.2 oz (84.687 kg)  11/08/12 187 lb 8 oz (85.049 kg)    The posterior pharynx is difficult to see in light of the patient's large tongue and gag reflex.  there is some erythema noted but no obvious secondary infection. Examination of neck reveals erythema along the left side. The lymph node in the left posterior upper neck area has softened up some but continues to be quite enlarged with little shrinkage thus far. Lungs - Normal respiratory effort, chest expands symmetrically. Lungs are clear to auscultation, no crackles or wheezes.  Heart has regular rhythm and rate  Abdomen is soft and non tender with normal bowel sounds  Assessment:  Patient tolerating treatments well with expected side effects as above  Plan: Continue treatment per original radiation prescription.  I discussed with patient today  consideration for adding radiosensitizing chemotherapy since his lymph node is responded very little at this time. He does not wish to consider this therapy. Also discussed stopping at a preoperative dose of radiation and considering surgery but the  patient does not wish to consider this option either.

## 2012-11-22 NOTE — Progress Notes (Signed)
Patient here for routine weekly assessment of left neck radiation.Doing well except moderated fatigue.Has lost all sense of taste but continues to make self eat.Weight maintained.Completed 20 of 35 treatments.Has some redness and dryness of skin.To keep applying biafine.

## 2012-11-23 ENCOUNTER — Ambulatory Visit
Admission: RE | Admit: 2012-11-23 | Discharge: 2012-11-23 | Disposition: A | Discharge status: Home / Self Care | Payer: Medicare Other | Source: Ambulatory Visit | Attending: Radiation Oncology | Admitting: Radiation Oncology

## 2012-11-25 ENCOUNTER — Ambulatory Visit: Payer: Medicare Other

## 2012-11-28 ENCOUNTER — Ambulatory Visit
Admission: RE | Admit: 2012-11-28 | Discharge: 2012-11-28 | Disposition: A | Discharge status: Home / Self Care | Payer: Medicare Other | Source: Ambulatory Visit | Attending: Radiation Oncology | Admitting: Radiation Oncology

## 2012-11-29 ENCOUNTER — Ambulatory Visit
Admission: RE | Admit: 2012-11-29 | Discharge: 2012-11-29 | Disposition: A | Discharge status: Home / Self Care | Payer: Medicare Other | Source: Ambulatory Visit | Attending: Radiation Oncology | Admitting: Radiation Oncology

## 2012-11-29 VITALS — BP 118/73 | HR 58 | Temp 98.5°F | Wt 180.2 lb

## 2012-11-29 DIAGNOSIS — C801 Malignant (primary) neoplasm, unspecified: Secondary | ICD-10-CM

## 2012-11-29 NOTE — Progress Notes (Signed)
[facility]    Radiation Oncology [address] [place]     [name], M.D. [place], [place] [address]               [name], M.D., Ph.D. Phone: [phone]      Molli Hazard A. Kathrynn Running, M.D. Fax: [phone]      Radene Gunning, M.D., Ph.D.         [name], M.D.         [name], M.D Weekly Treatment Management Note  Name: Cory Serrano     MRN: [id number]        CSN: [id number] Date: 11/29/2012      DOB: 08-18-1935  CC: [name], MD         [name]    Status: Outpatient  Diagnosis: The encounter diagnosis was Cancer.presenting in the left upper neck.  Current Dose: 4600 cGy  Current Fraction: 23  Planned Dose: 7000 cGy  Narrative: Cory Serrano was seen today for weekly treatment management. The chart was checked and MVCT  were reviewed. He does have a sore throat but is able to eat most solid foods at this time.  He occasionally will have some itching in the treatment area and I  recommended hydrocortisone cream if this keeps him awake at night. He continues to use Biafine for her skin. Patient continues to have a poor taste. He will see the nutritionist next week.  Review of patient's allergies indicates no known allergies.  Current Outpatient Prescriptions  Medication Sig Dispense Refill  . amLODipine-benazepril (LOTREL) 5-20 MG per capsule Take 1 tablet by mouth Daily.      [name] aspirin 81 MG tablet Take 81 mg by mouth daily.      [name] atenolol (TENORMIN) 50 MG tablet Take 50 mg by mouth daily.      . Coenzyme Q10 (CO Q 10) 100 MG CAPS Take 100 mg by mouth 2 (two) times daily.      [name] desoximetasone (TOPICORT) 0.25 % cream       . hydrochlorothiazide (HYDRODIURIL) 25 MG tablet Take 12.5 mg by mouth Daily.      . Multiple Vitamins-Minerals (MULTIVITAMIN PO) Take 1 tablet by mouth daily.       . Omega-3 Fatty Acids (FISH OIL) 1200 MG CAPS Take 2 capsules by mouth 2 (two) times daily.      . simvastatin (ZOCOR) 40 MG tablet Take 40  mg by mouth every evening.       . sodium fluoride (PREVIDENT 5000 PLUS) 1.1 % CREA dental cream Apply thin ribbon of cream to tooth brush. Brush teeth for 2 minutes. Spit out excess-DO NOT swallow. DO NOT rinse afterwards. Repeat nightly.  1 Tube  PRN   No current facility-administered medications for this encounter.   Facility-Administered Medications Ordered in Other Encounters  Medication Dose Route Frequency Provider Last Rate Last Dose  . sodium chloride 0.9 % injection 10 mL  10 mL Intravenous Once [name], MD       Labs:  Lab Results  Component Value Date   WBC 6.4 09/23/2012   HGB 14.1 09/23/2012   HCT 40.1 09/23/2012   MCV 93.0 09/23/2012   PLT 154 09/23/2012   Lab Results  Component Value Date   CREATININE 0.85 09/23/2012   BUN 16 09/23/2012   NA 137 09/23/2012   K 4.5 09/23/2012   CL 100 09/23/2012   CO2 29 09/23/2012   No results found for this  basename: ALT, AST, GGT, ALK, PHOS, BILITOT    Physical Examination:  weight is 180 lb 3.2 oz (81.738 kg). His temperature is 98.5 F (36.9 C). His blood pressure is 118/73 and his pulse is 58.    Wt Readings from Last 3 Encounters:  11/29/12 180 lb 3.2 oz (81.738 kg)  11/22/12 183 lb (83.008 kg)  11/15/12 186 lb 11.2 oz (84.687 kg)   The oral cavity is moist without secondary infection.  examination of the left neck area reveals erythema and dry desquamation without moist desquamation.  the left upper posterior  lymph node continues to show little shrinkage.  Estimated size today is 3.5 x 3.5 cm  Lungs - Normal respiratory effort, chest expands symmetrically. Lungs are clear to auscultation, no crackles or wheezes.  Heart has regular rhythm and rate  Abdomen is soft and non tender with normal bowel sounds  Assessment:  Patient tolerating treatments well except for issues as above  Plan: Continue treatment per original radiation prescription.  I again discussed radiosensitizing chemotherapy with the patient. We also  discussed stopping at a preoperative dose of radiation therapy. Patient does not wish to proceed with either of these treatment approaches and wishes to continue with full dose radiation therapy.

## 2012-11-29 NOTE — Progress Notes (Signed)
Patient here for weekly assessment of left neck radiation.Completed 23 of 35 treatments.Diminished appetite and loss of taste buds.Skin more irritated.Start of mild fatigue but able to walk at least 3 times per week.Has lost 3 lbs in last week.

## 2012-11-30 ENCOUNTER — Ambulatory Visit
Admission: RE | Admit: 2012-11-30 | Discharge: 2012-11-30 | Disposition: A | Discharge status: Home / Self Care | Payer: Medicare Other | Source: Ambulatory Visit | Attending: Radiation Oncology | Admitting: Radiation Oncology

## 2012-12-01 ENCOUNTER — Ambulatory Visit
Admission: RE | Admit: 2012-12-01 | Discharge: 2012-12-01 | Disposition: A | Discharge status: Home / Self Care | Payer: Medicare Other | Source: Ambulatory Visit | Attending: Radiation Oncology | Admitting: Radiation Oncology

## 2012-12-02 ENCOUNTER — Ambulatory Visit
Admission: RE | Admit: 2012-12-02 | Discharge: 2012-12-02 | Disposition: A | Discharge status: Home / Self Care | Payer: Medicare Other | Source: Ambulatory Visit | Attending: Radiation Oncology | Admitting: Radiation Oncology

## 2012-12-05 ENCOUNTER — Ambulatory Visit
Admission: RE | Admit: 2012-12-05 | Discharge: 2012-12-05 | Disposition: A | Discharge status: Home / Self Care | Payer: Medicare Other | Source: Ambulatory Visit | Attending: Radiation Oncology | Admitting: Radiation Oncology

## 2012-12-05 ENCOUNTER — Ambulatory Visit: Payer: Medicare Other | Admitting: Nutrition

## 2012-12-05 NOTE — Progress Notes (Signed)
This is a 77 year old male patient diagnosed with left neck cancer. Patient is receiving radiation only. At this time he declines chemotherapy.  Past medical history includes hypertension, BPH, cardiomyopathy, arthritis, and hyperlipidemia.  Medications include coenzyme Q10, multivitamin, omega-3 fatty acids, and Zocor.  Labs include glucose 102 on September 27.  Height: 5 feet 10 inches. Weight: 180.2 pounds December 3. Usual body weight: 187 pounds November 13. BMI: 25.86.  Patient reports side effects include dry mouth, sore throat, poor appetite, and taste alterations. He previously enjoyed salty or snack foods however at this time cannot tolerate them. He is drinking Ensure with ice cream twice a day for breakfast and in the mid afternoon. He specifically complains about metallic taste. Patient reports that he wished he had met with me earlier in his treatment.  Nutrition diagnosis: Unintended weight loss related to diagnosis of neck cancer and associated treatments as evidenced by a 7 pound weight loss in 3 weeks.  Intervention: Patient was educated on strategies for improving taste. I've encouraged him to rinse mouth prior to eating. I provided strategies for dealing with dry mouth and sore throat. I've encouraged him to try to eat and drink more often. I've encouraged he increase his Ensure to Ensure Plus with ice cream as desired 3 times a day between meals. I provided fact sheets for patient along with oral nutrition supplements and recipes.  Monitoring, evaluation, goals: Patient will tolerate increased oral intake to minimize side effects to promote weight maintenance.  Next visit: Patient will contact me with questions or concerns. He has only 8 radiation treatments left.

## 2012-12-06 ENCOUNTER — Ambulatory Visit
Admission: RE | Admit: 2012-12-06 | Discharge: 2012-12-06 | Disposition: A | Discharge status: Home / Self Care | Payer: Medicare Other | Source: Ambulatory Visit | Attending: Radiation Oncology | Admitting: Radiation Oncology

## 2012-12-07 ENCOUNTER — Ambulatory Visit
Admission: RE | Admit: 2012-12-07 | Discharge: 2012-12-07 | Disposition: A | Discharge status: Home / Self Care | Payer: Medicare Other | Source: Ambulatory Visit | Attending: Radiation Oncology | Admitting: Radiation Oncology

## 2012-12-07 VITALS — BP 133/67 | HR 53 | Temp 97.6°F | Wt 180.1 lb

## 2012-12-07 DIAGNOSIS — C801 Malignant (primary) neoplasm, unspecified: Secondary | ICD-10-CM

## 2012-12-07 MED ORDER — SILVER SULFADIAZINE 1 % EX CREA
TOPICAL_CREAM | Freq: Two times a day (BID) | CUTANEOUS | Status: DC
  Administered 2012-12-07: 1 via TOPICAL

## 2012-12-07 NOTE — Progress Notes (Signed)
Patient here for weekly under treat assessment for left neck radiation treatments.Completed  29 of 35.Skin is red with some peeling and drainage.Pain level  4 at all times but 6 on turning head.Takes aleve with minimum effectiveness.Some difficulty on swallowing and dry mouth.Weight down only 3 lbs in las 2 weeks.Instructed on skin care.Will apply telfa pad and conform dressing.

## 2012-12-07 NOTE — Progress Notes (Signed)
[facility]    Radiation Oncology [address] [place]     [name], M.D. [place], [place] [address]               [name], M.D., Ph.D. Phone: [phone]      Molli Hazard A. Kathrynn Running, M.D. Fax: [phone]      Radene Gunning, M.D., Ph.D.         [name], M.D.         [name], M.D Weekly Treatment Management Note  Name: Cory Serrano     MRN: [id number]        CSN: [id number] Date: 12/07/2012      DOB: 04-23-1935  CC: [name], MD         [name]    Status: Outpatient  Diagnosis: The encounter diagnosis was Cancer.  Current Dose: 5800  CGy  Current Fraction: 29  Planned Dose: 7000 cGy  Narrative: Cory Serrano was seen today for weekly treatment management. The chart was checked and MVCT  were reviewed. He continues to complain of a dry mouth and metallic taste. The patient did see the nutritionist to since my last evaluation and was given some additional information. The patient has not lost any weight over the past week.  Patient has noticed some drainage from the left neck area  Review of patient's allergies indicates no known allergies.  Current Outpatient Prescriptions  Medication Sig Dispense Refill  . amLODipine-benazepril (LOTREL) 5-20 MG per capsule Take 1 tablet by mouth Daily.      [name] aspirin 81 MG tablet Take 81 mg by mouth daily.      [name] atenolol (TENORMIN) 50 MG tablet Take 50 mg by mouth daily.      . Coenzyme Q10 (CO Q 10) 100 MG CAPS Take 100 mg by mouth 2 (two) times daily.      [name] desoximetasone (TOPICORT) 0.25 % cream       . hydrochlorothiazide (HYDRODIURIL) 25 MG tablet Take 12.5 mg by mouth Daily.      . Multiple Vitamins-Minerals (MULTIVITAMIN PO) Take 1 tablet by mouth daily.       . Omega-3 Fatty Acids (FISH OIL) 1200 MG CAPS Take 2 capsules by mouth 2 (two) times daily.      . simvastatin (ZOCOR) 40 MG tablet Take 40 mg by mouth every evening.       . sodium fluoride (PREVIDENT 5000 PLUS) 1.1 % CREA  dental cream Apply thin ribbon of cream to tooth brush. Brush teeth for 2 minutes. Spit out excess-DO NOT swallow. DO NOT rinse afterwards. Repeat nightly.  1 Tube  PRN   Current Facility-Administered Medications  Medication Dose Route Frequency Provider Last Rate Last Dose  . silver sulfADIAZINE (SILVADENE) 1 % cream   Topical BID [name], MD       Facility-Administered Medications Ordered in Other Encounters  Medication Dose Route Frequency Provider Last Rate Last Dose  . sodium chloride 0.9 % injection 10 mL  10 mL Intravenous Once [name], MD       Labs:  Lab Results  Component Value Date   WBC 6.4 09/23/2012   HGB 14.1 09/23/2012   HCT 40.1 09/23/2012   MCV 93.0 09/23/2012   PLT 154 09/23/2012   Lab Results  Component Value Date   CREATININE 0.85 09/23/2012   BUN 16 09/23/2012   NA 137 09/23/2012   K 4.5 09/23/2012   CL 100 09/23/2012   CO2  29 09/23/2012   No results found for this basename: ALT,  AST,  GGT,  ALK,  PHOS,  BILITOT    Physical Examination:  weight is 180 lb 1.6 oz (81.693 kg). His temperature is 97.6 F (36.4 C). His blood pressure is 133/67 and his pulse is 53. His oxygen saturation is 100%.    Wt Readings from Last 3 Encounters:  12/07/12 180 lb 1.6 oz (81.693 kg)  11/29/12 180 lb 3.2 oz (81.738 kg)  11/22/12 183 lb (83.008 kg)    The left neck area shows some moist desquamation quickly in the lower aspect of the neck. The left upper neck lymph node mass has flattened out since my last examination and feels somewhat smaller. Lungs - Normal respiratory effort, chest expands symmetrically. Lungs are clear to auscultation, no crackles or wheezes.  Heart has regular rhythm and rate  Abdomen is soft and non tender with normal bowel sounds  Assessment:  Patient tolerating treatments well except for issues as above  Plan: Continue treatment per original radiation prescription. Patient will be given Silvadene.

## 2012-12-07 NOTE — Addendum Note (Signed)
Encounter addended by: Tessa Lerner, RN on: 12/07/2012 12:46 PM<BR>     Documentation filed: Inpatient MAR

## 2012-12-08 ENCOUNTER — Ambulatory Visit
Admission: RE | Admit: 2012-12-08 | Discharge: 2012-12-08 | Disposition: A | Discharge status: Home / Self Care | Payer: Medicare Other | Source: Ambulatory Visit | Attending: Radiation Oncology | Admitting: Radiation Oncology

## 2012-12-09 ENCOUNTER — Ambulatory Visit
Admission: RE | Admit: 2012-12-09 | Discharge: 2012-12-09 | Disposition: A | Discharge status: Home / Self Care | Payer: Medicare Other | Source: Ambulatory Visit | Attending: Radiation Oncology | Admitting: Radiation Oncology

## 2012-12-12 ENCOUNTER — Ambulatory Visit
Admission: RE | Admit: 2012-12-12 | Discharge: 2012-12-12 | Disposition: A | Discharge status: Home / Self Care | Payer: Medicare Other | Source: Ambulatory Visit | Attending: Radiation Oncology | Admitting: Radiation Oncology

## 2012-12-13 ENCOUNTER — Ambulatory Visit
Admission: RE | Admit: 2012-12-13 | Discharge: 2012-12-13 | Disposition: A | Discharge status: Home / Self Care | Payer: Medicare Other | Source: Ambulatory Visit | Attending: Radiation Oncology | Admitting: Radiation Oncology

## 2012-12-13 ENCOUNTER — Encounter: Payer: Self-pay | Admitting: Radiation Oncology

## 2012-12-13 VITALS — BP 127/69 | HR 57 | Temp 98.4°F | Resp 20 | Wt 177.2 lb

## 2012-12-13 DIAGNOSIS — C801 Malignant (primary) neoplasm, unspecified: Secondary | ICD-10-CM

## 2012-12-13 NOTE — Progress Notes (Signed)
Pt c/o mild pain in throat and left neck from skin irritation. Pt applying Silvadene to left neck w/good relief of irritation. He eats soft foods, small bites, drinks Ensure daily at breakfast. Pt c/o food having either no taste or metallic taste. Pt c/o dry mouth, no issues w/thick saliva. Pt exercising jaw, enc he continue. Gave pt FU card; he completes tx 12/15/12.

## 2012-12-13 NOTE — Progress Notes (Signed)
[facility]    Radiation Oncology [address] [place]     [name], M.D. [place], [place] [address]               Cory Serrano, M.D., Ph.D. Phone: [phone]      Molli Hazard A. Kathrynn Running, M.D. Fax: [phone]      Radene Gunning, M.D., Ph.D.         [name], M.D.         [name], M.D Weekly Treatment Management Note  Name: Cory Serrano     MRN: [id number]        CSN: [id number] Date: 12/13/2012      DOB: January 04, 1935  CC: Cory Fillers, MD         Cory Serrano    Status: Outpatient  Diagnosis: The encounter diagnosis was Cancer.  Current Dose: 66.0 Gy  Current Fraction: 33  Planned Dose: 70.0 Gy  Narrative: Cory Serrano was seen today for weekly treatment management. The chart was checked and MVCT  were reviewed. He does feel the Silvadene has helped with his left neck skin reaction. He continues to have a metallic taste.   he is able to eat soft foods  Review of patient's allergies indicates no known allergies.  Current Outpatient Prescriptions  Medication Sig Dispense Refill  . amLODipine-benazepril (LOTREL) 5-20 MG per capsule Take 1 tablet by mouth Daily.      Cory Serrano aspirin 81 MG tablet Take 81 mg by mouth daily.      Cory Serrano atenolol (TENORMIN) 50 MG tablet Take 50 mg by mouth daily.      . Coenzyme Q10 (CO Q 10) 100 MG CAPS Take 100 mg by mouth 2 (two) times daily.      Cory Serrano desoximetasone (TOPICORT) 0.25 % cream       . hydrochlorothiazide (HYDRODIURIL) 25 MG tablet Take 12.5 mg by mouth Daily.      . Multiple Vitamins-Minerals (MULTIVITAMIN PO) Take 1 tablet by mouth daily.       . Omega-3 Fatty Acids (FISH OIL) 1200 MG CAPS Take 2 capsules by mouth 2 (two) times daily.      . silver sulfADIAZINE (SILVADENE) 1 % cream Apply topically 2 (two) times daily.      . simvastatin (ZOCOR) 40 MG tablet Take 40 mg by mouth every evening.       . sodium fluoride (PREVIDENT 5000 PLUS) 1.1 % CREA dental cream Apply thin ribbon of cream to tooth brush.  Brush teeth for 2 minutes. Spit out excess-DO NOT swallow. DO NOT rinse afterwards. Repeat nightly.  1 Tube  PRN   No current facility-administered medications for this encounter.   Facility-Administered Medications Ordered in Other Encounters  Medication Dose Route Frequency Provider Last Rate Last Dose  . sodium chloride 0.9 % injection 10 mL  10 mL Intravenous Once Cory Lade, MD       Labs:  Lab Results  Component Value Date   WBC 6.4 09/23/2012   HGB 14.1 09/23/2012   HCT 40.1 09/23/2012   MCV 93.0 09/23/2012   PLT 154 09/23/2012   Lab Results  Component Value Date   CREATININE 0.85 09/23/2012   BUN 16 09/23/2012   NA 137 09/23/2012   K 4.5 09/23/2012   CL 100 09/23/2012   CO2 29 09/23/2012   No results found for this basename: ALT, AST, GGT, ALK, PHOS, BILITOT    Physical Examination:  weight is 177 lb 3.2 oz (80.377 kg).  His oral temperature is 98.4 F (36.9 C). His blood pressure is 127/69 and his pulse is 57. His respiration is 20.    Wt Readings from Last 3 Encounters:  12/13/12 177 lb 3.2 oz (80.377 kg)  12/07/12 180 lb 1.6 oz (81.693 kg)  11/29/12 180 lb 3.2 oz (81.738 kg)    The patient has healing moist desquamation in the left low neck area.  The lymph node continues to be significantly enlarged but seems flatter on exam today Lungs - Normal respiratory effort, chest expands symmetrically. Lungs are clear to auscultation, no crackles or wheezes.  Heart has regular rhythm and rate  Abdomen is soft and non tender with normal bowel sounds  Assessment:  Patient tolerating treatments well except for issues as above  Plan: Continue treatment per original radiation prescription

## 2012-12-14 ENCOUNTER — Ambulatory Visit
Admission: RE | Admit: 2012-12-14 | Discharge: 2012-12-14 | Disposition: A | Discharge status: Home / Self Care | Payer: Medicare Other | Source: Ambulatory Visit | Attending: Radiation Oncology | Admitting: Radiation Oncology

## 2012-12-15 ENCOUNTER — Ambulatory Visit
Admission: RE | Admit: 2012-12-15 | Discharge: 2012-12-15 | Disposition: A | Discharge status: Home / Self Care | Payer: Medicare Other | Source: Ambulatory Visit | Attending: Radiation Oncology | Admitting: Radiation Oncology

## 2012-12-15 DIAGNOSIS — R221 Localized swelling, mass and lump, neck: Secondary | ICD-10-CM

## 2012-12-15 MED ORDER — BIAFINE EX EMUL
CUTANEOUS | Status: DC | PRN
  Administered 2012-12-15: 1 via TOPICAL

## 2012-12-16 ENCOUNTER — Ambulatory Visit: Payer: Medicare Other

## 2012-12-22 ENCOUNTER — Encounter: Payer: Self-pay | Admitting: Radiation Oncology

## 2012-12-22 NOTE — Progress Notes (Signed)
  Radiation Oncology         [phone]) [phone] ________________________________  Name: Cory Serrano MRN: [id number]  Date: 12/22/2012  DOB: 1935-11-19  End of Treatment Note  Diagnosis:   Metastatic squamous cell carcinoma presumably of skin origin to the left posterior neck     Indication for treatment:  Definitive treatment       Radiation treatment dates:   10/27/2012 through 12/15/2012  Site/dose:   Left neck area 7000 cGy in 35 fractions  Beams/energy:   Helical intensity modulated radiation therapy, using 6 MV photons  Narrative: The patient tolerated radiation treatment relatively well.   He did experience fatigue during the latter portion of his treatment. He also experienced pain with swallowing as well as taste problems. He also developed moist desquamation along the left neck which was treated well with Silvadene. His left posterior upper neck mass decreased minimally during the course of his treatment.  He was offered the option of stopping at a preoperative dose and consider surgery, but the patient elected to proceed with a definitive course of radiation therapy.  Plan: The patient has completed radiation treatment. The patient will return to radiation oncology clinic for routine followup in one month. I advised them to call or return sooner if they have any questions or concerns related to their recovery or treatment.  -----------------------------------  [name], PhD, MD

## 2012-12-22 NOTE — Progress Notes (Signed)
   Department of Radiation Oncology  Phone:  [phone] Fax:        [phone]   Intensity modulated radiation therapy simulation note  On 10/24/2012 the patient had completion of his IMRT plan directed at the left neck region IMRT was chosen over conventional or conformal ration therapy to more accurately target the area of concern and to limit dose to normal surrounding critical structures i.e. the spinal cord and parotid region. DVH's of the target structures as well as critical structures are reviewed accepted and placed in the patient's chart. Plan is for the patient received 7000 cGy in 35 fractions.  -----------------------------------  [name], PhD, MD

## 2013-01-12 ENCOUNTER — Ambulatory Visit
Admission: RE | Admit: 2013-01-12 | Discharge: 2013-01-12 | Disposition: A | Discharge status: Home / Self Care | Payer: Medicare Other | Source: Ambulatory Visit | Attending: Radiation Oncology | Admitting: Radiation Oncology

## 2013-01-12 ENCOUNTER — Encounter: Payer: Self-pay | Admitting: Radiation Oncology

## 2013-01-12 VITALS — BP 124/84 | HR 57 | Temp 97.0°F | Resp 16 | Wt 176.6 lb

## 2013-01-12 DIAGNOSIS — C801 Malignant (primary) neoplasm, unspecified: Secondary | ICD-10-CM

## 2013-01-12 NOTE — Progress Notes (Signed)
Patient presents to the clinic today unaccompanied for a follow up appointment with Dr. Roselind Messier. Patient is alert and oriented to person, place, and time. No distress noted. Steady gait noted. Pleasant affect noted. Patient denies pain at this time. Patient reports that the painful swallowing he suffered from at the completion of treatment has resolved. Patient reports that his beard has no grown back/filled in completely yet. Patient reports that he continues to have a sore throat. No hoarseness noted of voice. Patient reports that his taste is slowly improving and back to about 80% of what it was. Patient reports that his swallowing continues to be mildly restricted. Patient reports dry mouth continues. Patient denies nausea, vomiting, headache or dizziness. Reported all findngs to Dr. Roselind Messier.    NO upcoming appointments

## 2013-01-12 NOTE — Progress Notes (Signed)
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  He continues to improve. He has mild sore throat but did not take any pain medication for this issue. Patient continues to have poor taste. He denies any discomfort or itching along the left neck area. I have encouraged him to continue his jaw exercises. He denies any breathing problems.                              ALLERGIES:   has no known allergies.  Meds: Current Outpatient Prescriptions  Medication Sig Dispense Refill  . amLODipine-benazepril (LOTREL) 5-20 MG per capsule Take 1 tablet by mouth Daily.      [name] aspirin 81 MG tablet Take 81 mg by mouth daily.      [name] atenolol (TENORMIN) 50 MG tablet Take 50 mg by mouth daily.      . Coenzyme Q10 (CO Q 10) 100 MG CAPS Take 100 mg by mouth 2 (two) times daily.      [name] desoximetasone (TOPICORT) 0.25 % cream       . hydrochlorothiazide (HYDRODIURIL) 25 MG tablet Take 12.5 mg by mouth Daily.      . Multiple Vitamins-Minerals (MULTIVITAMIN PO) Take 1 tablet by mouth daily.       . Omega-3 Fatty Acids (FISH OIL) 1200 MG CAPS Take 2 capsules by mouth 2 (two) times daily.      . simvastatin (ZOCOR) 40 MG tablet Take 40 mg by mouth every evening.       . sodium fluoride (PREVIDENT 5000 PLUS) 1.1 % CREA dental cream Apply thin ribbon of cream to tooth brush. Brush teeth for 2 minutes. Spit out excess-DO NOT swallow. DO NOT rinse afterwards. Repeat nightly.  1 Tube  PRN  . silver sulfADIAZINE (SILVADENE) 1 % cream Apply topically 2 (two) times daily.       No current facility-administered medications for this encounter.   Facility-Administered Medications  Ordered in Other Encounters  Medication Dose Route Frequency Provider Last Rate Last Dose  . sodium chloride 0.9 % injection 10 mL  10 mL Intravenous Once [name], MD        Physical Findings: The patient is in no acute distress. Patient is alert and oriented.  weight is 176 lb 9.6 oz (80.105 kg). His oral temperature is 97 F (36.1 C). His blood pressure is 124/84 and his pulse is 57. His respiration is 16. .  The lungs are clear. The heart has a regular rhythm and rate. Examination of the skin of the neck area reveals it to be healed at this time. The nasal cavity reveals no secondary infection. The mucosa is mildly dry. Palpation along the left neck area reveals shrinkage of his left posterior neck mass. Area has flattened out significantly. He continues have an area of induration measuring approximately 2.5 x 2 half centimeters  Lab Findings: Lab Results  Component Value Date   WBC 6.4 09/23/2012   HGB 14.1 09/23/2012   HCT 40.1 09/23/2012   MCV 93.0 09/23/2012   PLT 154 09/23/2012    @LASTCHEM @  Radiographic  Findings: No results found.  Impression:  The patient is recovering from the effects of radiation.  The patient's left neck node continues to decrease in size  Plan:  Routine followup in 2 months. Patient will likely undergo imaging after this followup appointment.  _____________________________________  -----------------------------------  [name], PhD, MD

## 2013-02-08 ENCOUNTER — Other Ambulatory Visit: Payer: Self-pay | Admitting: Dermatology

## 2013-03-16 ENCOUNTER — Encounter: Payer: Self-pay | Admitting: Radiation Oncology

## 2013-03-16 ENCOUNTER — Ambulatory Visit
Admission: RE | Admit: 2013-03-16 | Discharge: 2013-03-16 | Disposition: A | Discharge status: Home / Self Care | Payer: Medicare Other | Source: Ambulatory Visit | Attending: Radiation Oncology | Admitting: Radiation Oncology

## 2013-03-16 VITALS — BP 130/78 | HR 60 | Temp 97.7°F | Resp 16 | Wt 173.7 lb

## 2013-03-16 NOTE — Progress Notes (Signed)
Radiation Oncology         [phone]) [phone] ________________________________  Name: Cory Serrano MRN: [id number]  Date: 03/16/2013  DOB: July 27, 1935  Follow-Up Visit Note  CC: Cory Fillers, MD  Cory Obey, MD  Diagnosis:  Metastatic squamous cell carcinoma to the left posterior neck   Interval Since Last Radiation:  3  months  Narrative:  The patient returns today for routine follow-up.  He seems to be doing well at this time.Cory Serrano He did develop an infection in his right ear and is seeing Dr. [name] for this issue.  He is having less problems with trismus. He continues to do his jaw exercises. He denies any pain along the left neck area. He has noticed some mental edema since his last followup appointment. Taste continues to improve and the patient estimates his taste level is 95% of baseline. He has some mild dryness in the oral cavity.                           ALLERGIES:  has No Known Allergies.  Meds: Current Outpatient Prescriptions  Medication Sig Dispense Refill  . amLODipine-benazepril (LOTREL) 5-20 MG per capsule Take 1 tablet by mouth Daily.      Cory Serrano aspirin 81 MG tablet Take 81 mg by mouth daily.      Cory Serrano atenolol (TENORMIN) 50 MG tablet Take 50 mg by mouth daily.      Cory Serrano desoximetasone (TOPICORT) 0.25 % cream       . hydrochlorothiazide (HYDRODIURIL) 25 MG tablet Take 12.5 mg by mouth Daily.      . Omega-3 Fatty Acids (FISH OIL) 1200 MG CAPS Take 2 capsules by mouth 2 (two) times daily.      . silver sulfADIAZINE (SILVADENE) 1 % cream Apply topically 2 (two) times daily.      . simvastatin (ZOCOR) 40 MG tablet Take 40 mg by mouth every evening.       . sodium fluoride (PREVIDENT 5000 PLUS) 1.1 % CREA dental cream Apply thin ribbon of cream to tooth brush. Brush teeth for 2 minutes. Spit out excess-DO NOT swallow. DO NOT rinse afterwards. Repeat nightly.  1 Tube  PRN  . Coenzyme Q10 (CO Q 10) 100 MG CAPS Take 100 mg by mouth 2 (two) times daily.      . Multiple  Vitamins-Minerals (MULTIVITAMIN PO) Take 1 tablet by mouth daily.        No current facility-administered medications for this encounter.   Facility-Administered Medications Ordered in Other Encounters  Medication Dose Route Frequency Provider Last Rate Last Dose  . sodium chloride 0.9 % injection 10 mL  10 mL Intravenous Once [name], MD        Physical Findings: The patient is in no acute distress. Patient is alert and oriented.  weight is 173 lb 11.2 oz (78.79 kg). His oral temperature is 97.7 F (36.5 C). His blood pressure is 130/78 and his pulse is 60. His respiration is 16. .  The lungs are clear to auscultation. The heart has a regular rhythm and rate. Examination of neck area reveals some induration in the left upper neck  but no palpable adenopathy at this time. There are no new areas of adenopathy on exam today. Oral cavity reveals mucosa to be moist without secondary infection. A thorough exam is not possible in light of patient's extreme  gag reflex.  He does have some submental edema is time.  Radiographic Findings: No results found.  Impression:  The patient is recovering from the effects of radiation.  He has had a favorable response to his radiation therapy at this time.  Plan:  I discussed consideration for a CT or PET scan to assess for complete response to therapy. I recommended these studies only if the patient would consider surgery if residual disease.  At this time the patient feels most comfortable with close followup and not proceeding with imaging studies.Cory Serrano He will return in radiation oncology 3 months for followup. he'll be seeing Dr. [name] again later this month or April for followup.  _____________________________________  -----------------------------------  [name], PhD, MD

## 2013-03-16 NOTE — Progress Notes (Signed)
Patient presents to the clinic today unaccompanied for follow up appointment with Dr. Roselind Messier. Patient alert and oriented to person, place, and time. No distress noted. Steady gait noted. Pleasant affect noted. Patient denies pain at this time. Patient reports that dry mouth continues. Patient reports his taste has improved to "about 95%." patient concerned about edema noted anterior neck. Patient reports that he seeing Dr. Donell Beers about blood drainage from right ear and muffled hearing. Patient reports he drinks one eight ounce can of ensure per day. Patient denies cough or shortness of breath. Patient reports difficulty swallowing occasional because "things feel constricted in there." patient reports a mild sore throat. Reported all findings to Dr. Roselind Messier.

## 2013-04-05 ENCOUNTER — Other Ambulatory Visit: Payer: Self-pay | Admitting: Otolaryngology

## 2013-04-05 DIAGNOSIS — H60399 Other infective otitis externa, unspecified ear: Secondary | ICD-10-CM

## 2013-04-05 DIAGNOSIS — M26609 Unspecified temporomandibular joint disorder, unspecified side: Secondary | ICD-10-CM

## 2013-04-07 ENCOUNTER — Ambulatory Visit
Admission: RE | Admit: 2013-04-07 | Discharge: 2013-04-07 | Disposition: A | Discharge status: Home / Self Care | Payer: Medicare Other | Source: Ambulatory Visit | Attending: Otolaryngology | Admitting: Otolaryngology

## 2013-04-07 DIAGNOSIS — H60399 Other infective otitis externa, unspecified ear: Secondary | ICD-10-CM

## 2013-04-07 DIAGNOSIS — M26609 Unspecified temporomandibular joint disorder, unspecified side: Secondary | ICD-10-CM

## 2013-04-07 MED ORDER — GADOBENATE DIMEGLUMINE 529 MG/ML IV SOLN
16.0000 mL | Freq: Once | INTRAVENOUS | Status: AC | PRN
  Administered 2013-04-07: 16 mL via INTRAVENOUS

## 2013-05-10 ENCOUNTER — Other Ambulatory Visit (HOSPITAL_COMMUNITY)
Admission: RE | Admit: 2013-05-10 | Discharge: 2013-05-10 | Disposition: A | Discharge status: Home / Self Care | Payer: Medicare Other | Source: Ambulatory Visit | Attending: Otolaryngology | Admitting: Otolaryngology

## 2013-05-10 ENCOUNTER — Other Ambulatory Visit: Payer: Self-pay | Admitting: Otolaryngology

## 2013-05-10 DIAGNOSIS — H6690 Otitis media, unspecified, unspecified ear: Secondary | ICD-10-CM | POA: Insufficient documentation

## 2013-05-17 ENCOUNTER — Encounter: Payer: Self-pay | Admitting: *Deleted

## 2013-05-17 DIAGNOSIS — Z923 Personal history of irradiation: Secondary | ICD-10-CM | POA: Insufficient documentation

## 2013-05-17 NOTE — Progress Notes (Signed)
Head and Neck Cancer Location of Tumor / Histology: parotid gland  Patient presented   months ago with symptoms of: persistent drainage, right preauricular area  Biopsies of parotid gland(if applicable) revealed: malignant cells consistent w/metastatic squamous cell carcinoma  Nutrition Status:  Weight changes: none  Swallowing status:   Plans, if any, for PEG tube: no  Tobacco/Marijuana/Snuff/ETOH use: quit smoking 45 yrs ago, 15 pk yr, drinks 6 oz alcohol weekly  Past/Anticipated interventions by otolaryngology, if any: multiple antibiotic course w/o response, FNA 05/11/13   Past/Anticipated interventions by medical oncology, if any:   Referrals yet, to any of the following?  Social Work? no  Dentistry? no  Swallowing therapy? no  Nutrition? no  Med/Onc? no  PEG placement? no  SAFETY ISSUES:  Prior radiation? Yes, left neck area, 7000 cGy in 35 fx, 10/27/12 - 12/15/12  Pacemaker/ICD? no  Possible current pregnancy? na  Is the patient on methotrexate? no  Current Complaints / other details:  Denies pain, has moderately dry mouth, slightly sore throat, denies difficulty swallowing but eats slowly, drinks liquid when swallowing, appetite at 90%, tastes at 85%, energy improving. States he has hearing loss in right ear.

## 2013-05-18 ENCOUNTER — Ambulatory Visit
Admission: RE | Admit: 2013-05-18 | Discharge: 2013-05-18 | Disposition: A | Discharge status: Home / Self Care | Payer: Medicare Other | Source: Ambulatory Visit | Attending: Radiation Oncology | Admitting: Radiation Oncology

## 2013-05-18 ENCOUNTER — Encounter: Payer: Self-pay | Admitting: Radiation Oncology

## 2013-05-18 VITALS — BP 131/78 | HR 53 | Temp 98.0°F | Resp 20 | Wt 165.0 lb

## 2013-05-18 DIAGNOSIS — R234 Changes in skin texture: Secondary | ICD-10-CM | POA: Insufficient documentation

## 2013-05-18 DIAGNOSIS — R609 Edema, unspecified: Secondary | ICD-10-CM | POA: Insufficient documentation

## 2013-05-18 DIAGNOSIS — R209 Unspecified disturbances of skin sensation: Secondary | ICD-10-CM | POA: Insufficient documentation

## 2013-05-18 DIAGNOSIS — R930 Abnormal findings on diagnostic imaging of skull and head, not elsewhere classified: Secondary | ICD-10-CM | POA: Insufficient documentation

## 2013-05-18 DIAGNOSIS — C801 Malignant (primary) neoplasm, unspecified: Secondary | ICD-10-CM

## 2013-05-18 DIAGNOSIS — C4492 Squamous cell carcinoma of skin, unspecified: Secondary | ICD-10-CM | POA: Insufficient documentation

## 2013-05-18 DIAGNOSIS — C792 Secondary malignant neoplasm of skin: Secondary | ICD-10-CM | POA: Insufficient documentation

## 2013-05-18 NOTE — Progress Notes (Signed)
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  Several weeks after the patient completed radiation therapy to the left neck he began having problems with his right ear. Patient was seen by Dr. [name] and initially the patient was felt to have an infection. He was treated on multiple antibiotics including drops and oral  antibiotics. Patient did have cultures of drainage from the right ear. 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 Patient is also developed some swelling in the right preauricular area. Given the non-response to antibiotic therapy and preauricular swelling the patient proceeded to undergo biopsy of this area surprisingly revealed metastatic squamous cell carcinoma. The surgical options for management of this would be extensive and the patient opted out of surgical intervention. He is interested in consideration for radiation therapy for this new issue.                            ALLERGIES:  has No Known Allergies.  Meds: Current Outpatient Prescriptions  Medication Sig Dispense Refill  . amLODipine-benazepril (LOTREL) 5-20 MG per capsule Take 1 tablet by mouth Daily.      [name] aspirin 81 MG tablet Take 81 mg by mouth daily.      [name] atenolol (TENORMIN) 50 MG tablet Take 50 mg by mouth daily.      . Coenzyme Q10 (CO Q 10) 100  MG CAPS Take 100 mg by mouth 2 (two) times daily.      [name] desoximetasone (TOPICORT) 0.25 % cream       . hydrochlorothiazide (HYDRODIURIL) 25 MG tablet Take 12.5 mg by mouth Daily.      . Multiple Vitamins-Minerals (MULTIVITAMIN PO) Take 1 tablet by mouth daily.       . Omega-3 Fatty Acids (FISH OIL) 1200 MG CAPS Take 2 capsules by mouth 2 (two) times daily.      . silver sulfADIAZINE (SILVADENE) 1 % cream Apply topically 2 (two) times daily.      . simvastatin (ZOCOR) 40 MG tablet Take 40 mg by mouth every evening.       . sodium fluoride (PREVIDENT 5000 PLUS) 1.1 % CREA dental cream Apply thin ribbon of cream to tooth brush. Brush teeth for 2 minutes. Spit out excess-DO NOT swallow. DO NOT rinse afterwards. Repeat nightly.  1 Tube  PRN   No current facility-administered medications for this encounter.   Facility-Administered Medications Ordered in Other Encounters  Medication Dose Route Frequency Provider Last Rate Last Dose  . sodium chloride 0.9 % injection 10 mL  10 mL Intravenous Once [name], MD        Physical Findings: The patient is in no acute distress. Patient is alert and oriented.  weight is 165 lb (74.844 kg). His oral temperature is 98 F (36.7 C). His blood pressure is 131/78 and  his pulse is 53. His respiration is 20. .  The lungs are clear. The heart has a regular rhythm and rate. There is no palpable axillary or supraclavicular adenopathy. Careful palpation along the left neck reveals some thickening in the upper left neck but no residual adenopathy. Palpation along the right neck reveals no adenopathy.  Examination of the right preauricular area reveals approximate 2 cm swelling which is just anterior to the tragus.  Examination of the right ear reveals area of crusting along the scaphoid fossa. A Q-tip was placed on this area which results in some drainage.   I attempted an external canal exam there was purulent drainage coming from this area and the exam was very  uncomfortable for the patient. I was unable to visualize the tympanic membrane.  Lab Findings: Lab Results  Component Value Date   WBC 6.4 09/23/2012   HGB 14.1 09/23/2012   HCT 40.1 09/23/2012   MCV 93.0 09/23/2012   PLT 154 09/23/2012      Radiographic Findings: MRI HEAD WITHOUT AND WITH CONTRAST 04/05/13 IMPRESSION:  Inflammatory change in the external auditory canal and pinna region  on the right. Some of this extends anterior towards the direction  of the parotid gland. There appears to be a dot of air in that  extension. There is some fluid in the mastoid air cells toward the  mastoid tip and petrous apex region without evidence of advanced or  destructive bony changes.   Impression:  Metastatic squamous cell carcinoma presumably of the scalp origin, metastatic to the right preauricular and auricular area.  The patient has numbness along his right mid face area and some numbness as well as right lower lip region. This is concerning for perineural involvement. Patient did have a MRI of the head area which did show inflammatory changes a long the external auditory canal and pinna region on the right. This did extend into the parotid gland region in addition. Presumably these changes are result of malignancy. For further evaluation the patient has been scheduled for a PET scan.   The patient would be a candidate for radiation therapy for this new issue. I would likely encompass the right ear,  right preauricular and right neck region. The patient did receive radiation therapy to his left upper neck and there should not be significant overlap with prior radiation therapy.  The patient understands that this will be a difficult area to treat given the involvement along the right ear. He also understands he will have more significant xerostomia as his right parotid will be treated to a high radiation dose.  He also understands that he will likely become deaf out of his right ear.  Plan:  The  patient will be reevaluated after his PET scan.  _____________________________________  -----------------------------------  [name], PhD, MD

## 2013-05-19 ENCOUNTER — Other Ambulatory Visit (HOSPITAL_COMMUNITY): Payer: Self-pay | Admitting: Otolaryngology

## 2013-05-19 DIAGNOSIS — IMO0002 Reserved for concepts with insufficient information to code with codable children: Secondary | ICD-10-CM

## 2013-05-24 ENCOUNTER — Encounter (HOSPITAL_COMMUNITY)
Admission: RE | Admit: 2013-05-24 | Discharge: 2013-05-24 | Disposition: A | Discharge status: Home / Self Care | Payer: Medicare Other | Source: Ambulatory Visit | Attending: Otolaryngology | Admitting: Otolaryngology

## 2013-05-24 ENCOUNTER — Encounter (HOSPITAL_COMMUNITY): Payer: Self-pay

## 2013-05-24 DIAGNOSIS — IMO0002 Reserved for concepts with insufficient information to code with codable children: Secondary | ICD-10-CM

## 2013-05-24 DIAGNOSIS — C801 Malignant (primary) neoplasm, unspecified: Secondary | ICD-10-CM | POA: Insufficient documentation

## 2013-05-24 LAB — GLUCOSE, CAPILLARY: Glucose-Capillary: 103 mg/dL — ABNORMAL HIGH (ref 70–99)

## 2013-05-24 MED ORDER — FLUDEOXYGLUCOSE F - 18 (FDG) INJECTION
19.9000 | Freq: Once | INTRAVENOUS | Status: AC | PRN
  Administered 2013-05-24: 19.9 via INTRAVENOUS

## 2013-05-29 ENCOUNTER — Telehealth (HOSPITAL_COMMUNITY): Payer: Self-pay | Admitting: Dental General Practice

## 2013-05-29 NOTE — Telephone Encounter (Signed)
05/29/2013   Patient:            Cory Serrano Date of Birth:  1935/09/14 MRN:                [id number] Called patient and discussed dental consultation appointment prior to radiation therapy as requested by Dr. Roselind Messier. Patient indicated that he has just seen his primary Dentist with no apparent problems noted.  Patient refused to schedule dental consult appointment.  Patient to call his primary Dentist if problems arise.   Djs/Dr, Kristin Bruins

## 2013-06-06 ENCOUNTER — Telehealth: Payer: Self-pay | Admitting: *Deleted

## 2013-06-06 NOTE — Telephone Encounter (Signed)
Called patient to ask question, lvm for a return call 

## 2013-06-07 ENCOUNTER — Ambulatory Visit
Admission: RE | Admit: 2013-06-07 | Discharge: 2013-06-07 | Disposition: A | Discharge status: Home / Self Care | Payer: Medicare Other | Source: Ambulatory Visit | Attending: Radiation Oncology | Admitting: Radiation Oncology

## 2013-06-07 VITALS — BP 152/70 | HR 59 | Temp 98.6°F | Resp 20

## 2013-06-07 DIAGNOSIS — Z510 Encounter for antineoplastic radiation therapy: Secondary | ICD-10-CM | POA: Insufficient documentation

## 2013-06-07 DIAGNOSIS — C801 Malignant (primary) neoplasm, unspecified: Secondary | ICD-10-CM

## 2013-06-07 DIAGNOSIS — R221 Localized swelling, mass and lump, neck: Secondary | ICD-10-CM

## 2013-06-07 DIAGNOSIS — R7989 Other specified abnormal findings of blood chemistry: Secondary | ICD-10-CM | POA: Insufficient documentation

## 2013-06-07 DIAGNOSIS — C44221 Squamous cell carcinoma of skin of unspecified ear and external auricular canal: Secondary | ICD-10-CM | POA: Insufficient documentation

## 2013-06-07 LAB — CBC WITH DIFFERENTIAL/PLATELET
BASO%: 0.6 % (ref 0.0–2.0)
EOS%: 0.9 % (ref 0.0–7.0)
MCH: 33.3 pg (ref 27.2–33.4)
MCHC: 34.8 g/dL (ref 32.0–36.0)
MONO#: 0.7 10*3/uL (ref 0.1–0.9)
RDW: 14.2 % (ref 11.0–14.6)
WBC: 4.9 10*3/uL (ref 4.0–10.3)
lymph#: 0.5 10*3/uL — ABNORMAL LOW (ref 0.9–3.3)

## 2013-06-07 LAB — BASIC METABOLIC PANEL (CC13)
Chloride: 101 mEq/L (ref 98–107)
Creatinine: 0.9 mg/dL (ref 0.7–1.3)
Potassium: 4.1 mEq/L (ref 3.5–5.1)
Sodium: 137 mEq/L (ref 136–145)

## 2013-06-07 MED ORDER — SODIUM CHLORIDE 0.9 % IJ SOLN
10.0000 mL | Freq: Once | INTRAMUSCULAR | Status: AC
  Administered 2013-06-07: 10 mL via INTRAVENOUS

## 2013-06-12 NOTE — Progress Notes (Signed)
  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  Identity was confirmed.  All relevant records and images related to the planned course of therapy were reviewed.  The patient freely provided informed written consent to proceed with treatment after reviewing the details related to the planned course of therapy. The consent form was witnessed and verified by the simulation staff.  Then, the patient was set-up in a stable reproducible  supine position for radiation therapy.  CT images were obtained.  Surface markings were placed.  The CT images were loaded into the planning software.  Then the target and avoidance structures were contoured.  Treatment planning then occurred.  The radiation prescription was entered and confirmed.  Then, I designed and supervised the construction of a total of 1 medically necessary complex treatment devices.  I have requested : Intensity Modulated Radiotherapy (IMRT) is medically necessary for this case for the following reason:  Critical CNS structure avoidance - brainstem, optic chiasm, optic nerve..  I have ordered dose calc.  PLAN:  The patient will receive 66 Gy in 30 fractions.  ________________________________   Special treatment procedure note  The patient has prior history of radiation treatment to the left upper neck region. Additional time was taken in reviewing this treatment as it relates to his current set up.  Given the increased potential for toxicities in light of his prior radiation therapy as well as additional time taken in reviewing his previous fields, this constitutes a special treatment  procedure. -----------------------------------  [name], PhD, MD

## 2013-06-15 ENCOUNTER — Ambulatory Visit: Payer: Medicare Other | Admitting: Radiation Oncology

## 2013-06-19 ENCOUNTER — Ambulatory Visit
Admission: RE | Admit: 2013-06-19 | Discharge: 2013-06-19 | Disposition: A | Discharge status: Home / Self Care | Payer: Medicare Other | Source: Ambulatory Visit | Attending: Radiation Oncology | Admitting: Radiation Oncology

## 2013-06-19 DIAGNOSIS — C801 Malignant (primary) neoplasm, unspecified: Secondary | ICD-10-CM

## 2013-06-19 NOTE — Progress Notes (Signed)
   Department of Radiation Oncology  Phone:  [phone] Fax:        [phone]  IMRT Device Note:   Today the patient began his radiation therapy and had set up of his IMRT device for helical intensity modulated radiation therapy. Patient will be treated with 29.5 sinogram segments. This constitutes 1 IMRT device.  -----------------------------------  [name], PhD, MD

## 2013-06-20 ENCOUNTER — Encounter: Payer: Self-pay | Admitting: Radiation Oncology

## 2013-06-20 ENCOUNTER — Telehealth: Payer: Self-pay | Admitting: *Deleted

## 2013-06-20 ENCOUNTER — Ambulatory Visit
Admission: RE | Admit: 2013-06-20 | Discharge: 2013-06-20 | Disposition: A | Discharge status: Home / Self Care | Payer: Medicare Other | Source: Ambulatory Visit | Attending: Radiation Oncology | Admitting: Radiation Oncology

## 2013-06-20 VITALS — BP 116/64 | HR 57 | Temp 98.7°F | Resp 20 | Wt 159.8 lb

## 2013-06-20 DIAGNOSIS — C801 Malignant (primary) neoplasm, unspecified: Secondary | ICD-10-CM

## 2013-06-20 DIAGNOSIS — R221 Localized swelling, mass and lump, neck: Secondary | ICD-10-CM

## 2013-06-20 MED ORDER — RADIAPLEXRX EX GEL
Freq: Once | CUTANEOUS | Status: AC
  Administered 2013-06-20: 13:00:00 via TOPICAL

## 2013-06-20 MED ORDER — ALRA NON-METALLIC DEODORANT (RAD-ONC)
1.0000 "application " | Freq: Once | TOPICAL | Status: AC
  Administered 2013-06-20: 1 via TOPICAL

## 2013-06-20 NOTE — Progress Notes (Signed)
[facility]    Radiation Oncology [address] [place]     [name], M.D. [place], [place] [address]               [name], M.D., Ph.D. Phone: [phone]      Molli Hazard A. Kathrynn Running, M.D. Fax: [phone]      Radene Gunning, M.D., Ph.D.         [name], M.D.         [name], M.D Weekly Treatment Management Note  Name: Cory Serrano     MRN: [id number]        CSN: [id number] Date: 06/20/2013      DOB: May 05, 1935  CC: Cory Fillers, MD         Cory Serrano    Status: Outpatient  Diagnosis: Metastatic squamous cell carcinoma of the skin to the right preauricular area and ear  Current Dose: 4.4 Gy  Current Fraction: 2  Planned Dose: 66 Gy  Narrative: Cory Serrano was seen today for weekly treatment management. The chart was checked and MVCT  were reviewed. He is tolerating the radiation therapy well thus far. He continues to have constant drainage out of the right ear area.  He also complains of markedly decreased hearing out of his right ear.  Review of patient's allergies indicates no known allergies.  Current Outpatient Prescriptions  Medication Sig Dispense Refill  . amLODipine-benazepril (LOTREL) 5-20 MG per capsule Take 1 tablet by mouth Daily.      [name] aspirin 81 MG tablet Take 81 mg by mouth daily.      [name] atenolol (TENORMIN) 50 MG tablet Take 50 mg by mouth daily.      . Coenzyme Q10 (CO Q 10) 100 MG CAPS Take 100 mg by mouth 2 (two) times daily.      [name] desoximetasone (TOPICORT) 0.25 % cream       . hydrochlorothiazide (HYDRODIURIL) 25 MG tablet Take 12.5 mg by mouth Daily.      . Multiple Vitamins-Minerals (MULTIVITAMIN PO) Take 1 tablet by mouth daily.       . Omega-3 Fatty Acids (FISH OIL) 1200 MG CAPS Take 2 capsules by mouth 2 (two) times daily.      . silver sulfADIAZINE (SILVADENE) 1 % cream Apply topically 2 (two) times daily.      . simvastatin (ZOCOR) 40 MG tablet Take 40 mg by mouth every evening.       . sodium  fluoride (PREVIDENT 5000 PLUS) 1.1 % CREA dental cream Apply thin ribbon of cream to tooth brush. Brush teeth for 2 minutes. Spit out excess-DO NOT swallow. DO NOT rinse afterwards. Repeat nightly.  1 Tube  PRN   No current facility-administered medications for this encounter.   Facility-Administered Medications Ordered in Other Encounters  Medication Dose Route Frequency Provider Last Rate Last Dose  . sodium chloride 0.9 % injection 10 mL  10 mL Intravenous Once [name], MD       Labs:  Lab Results  Component Value Date   WBC 4.9 06/07/2013   HGB 12.4* 06/07/2013   HCT 35.7* 06/07/2013   MCV 95.6 06/07/2013   PLT 174 06/07/2013   Lab Results  Component Value Date   CREATININE 0.9 06/07/2013   BUN 11.5 06/07/2013   NA 137 06/07/2013   K 4.1 06/07/2013   CL 101 06/07/2013   CO2 27 06/07/2013   No results found for this basename: ALT, AST, GGT, ALK, PHOS, BILITOT  Physical Examination:  weight is 159 lb 12.8 oz (72.485 kg). His oral temperature is 98.7 F (37.1 C). His blood pressure is 116/64 and his pulse is 57. His respiration is 20.    Wt Readings from Last 3 Encounters:  06/20/13 159 lb 12.8 oz (72.485 kg)  05/18/13 165 lb (74.844 kg)  03/16/13 173 lb 11.2 oz (78.79 kg)    No appreciable radiation reaction along the right year or preauricular area at this time. Patient has some drainage along the scaphoid fossa and triangular fossa area..  The right external ear canal is markedly narrowed. Was too painful for the patient to do exam of the external canal Lungs - Normal respiratory effort, chest expands symmetrically. Lungs are clear to auscultation, no crackles or wheezes.  Heart has regular rhythm and rate  Abdomen is soft and non tender with normal bowel sounds  Assessment:  Patient tolerating treatments well thus far.  Plan: Continue treatment per original radiation prescription

## 2013-06-20 NOTE — Addendum Note (Signed)
Encounter addended by: Glennie Hawk, RN on: 06/20/2013  1:14 PM<BR>     Documentation filed: Inpatient MAR, Orders

## 2013-06-20 NOTE — Progress Notes (Addendum)
Pt education completed w/pt. Gave pt "Radiation and You" booklet w/all pertinent information marked and discussed, re: fatigue, hair loss/care, skin irritation/care, mouth and throat irritation/care, nutrition, pain. Gave pt Radiaplex lotion w/instructions for proper use. All questions answered.  Pt c/o slight sore throat he states has continued since he had radiation to mouth /neck last fall. He is fatigued, has loss of appetite due to dry mouth and occasional bouts of thick saliva. Pt states in his right ear he feels pressure inside the canal, not painful, drains clear to bloody drainage continually.  Pt will be scheduled to see nutritionist. He had consult w/ her last fall, drinks Ensures at times.

## 2013-06-20 NOTE — Telephone Encounter (Signed)
CALLED PATIENT TO INFORM OF NUTRITION ON 06-23-13 AT 11:15 AM WITH BARBARA NEFF, LVM FOR A RETURN CALL

## 2013-06-20 NOTE — Addendum Note (Signed)
Encounter addended by: Glennie Hawk, RN on: 06/20/2013  1:11 PM<BR>     Documentation filed: Visit Diagnoses

## 2013-06-21 ENCOUNTER — Ambulatory Visit
Admission: RE | Admit: 2013-06-21 | Discharge: 2013-06-21 | Disposition: A | Discharge status: Home / Self Care | Payer: Medicare Other | Source: Ambulatory Visit | Attending: Radiation Oncology | Admitting: Radiation Oncology

## 2013-06-22 ENCOUNTER — Ambulatory Visit: Payer: Medicare Other

## 2013-06-22 ENCOUNTER — Ambulatory Visit
Admission: RE | Admit: 2013-06-22 | Discharge: 2013-06-22 | Disposition: A | Discharge status: Home / Self Care | Payer: Medicare Other | Source: Ambulatory Visit | Attending: Radiation Oncology | Admitting: Radiation Oncology

## 2013-06-23 ENCOUNTER — Ambulatory Visit: Payer: Medicare Other

## 2013-06-23 ENCOUNTER — Ambulatory Visit
Admission: RE | Admit: 2013-06-23 | Discharge: 2013-06-23 | Disposition: A | Discharge status: Home / Self Care | Payer: Medicare Other | Source: Ambulatory Visit | Attending: Radiation Oncology | Admitting: Radiation Oncology

## 2013-06-23 ENCOUNTER — Ambulatory Visit: Payer: Medicare Other | Admitting: Nutrition

## 2013-06-23 NOTE — Progress Notes (Signed)
Patient presents to nutrition consult.  I last saw patient December of 2013 when he was diagnosed with left neck cancer and received radiation therapy.  At that point in time patient weighed 180.2 pounds 11/29/2012.  Patient presents to nutrition appointment reporting he is now beginning a series of radiation therapy for his right preauricular area and ear.  He would like a "review" of nutrition education for nutrition impact symptoms.  Weight: 06/20/13 159.8 pounds. BMI: 22.93.  Nutrition diagnosis: Unintended weight loss continues.  Intervention: Patient was educated to increase Ensure Plus to 3-4 times daily between meals.  I have educated patient on strategies for increasing calories and protein.  I have also educated him on altering textures and temperatures of foods for better tolerance.  I have recommended patient rinse his mouth out prior to eating.  I provided multiple fact sheets and my contact information.  I've also provided patient with one complementary case of Ensure Plus to take with him today.  Monitoring, evaluation, goals: Patient will tolerate increased oral intake with Ensure Plus or boost +3-4 times daily.  Next visit: Patient will contact me with questions or concerns

## 2013-06-26 ENCOUNTER — Ambulatory Visit
Admission: RE | Admit: 2013-06-26 | Discharge: 2013-06-26 | Disposition: A | Discharge status: Home / Self Care | Payer: Medicare Other | Source: Ambulatory Visit | Attending: Radiation Oncology | Admitting: Radiation Oncology

## 2013-06-26 ENCOUNTER — Telehealth: Payer: Self-pay | Admitting: *Deleted

## 2013-06-26 DIAGNOSIS — C801 Malignant (primary) neoplasm, unspecified: Secondary | ICD-10-CM

## 2013-06-26 MED ORDER — MAGIC MOUTHWASH: 15.0000 mL | Freq: Four times a day (QID) | ORAL | Status: DC

## 2013-06-26 MED ORDER — HYDROCODONE-ACETAMINOPHEN 7.5-325 MG/15ML PO SOLN: 15.0000 mL | ORAL | Status: DC | PRN

## 2013-06-26 NOTE — Progress Notes (Signed)
Weekly rad tx right neck/ear, weight 158.6lb, fatigue starting, saw barbar neff this past Friday, drinking ensure plus, takes small bites, having difficulty swalowing foods, sore throat,uses chloraseptic spray, no erythema noted, yusung radiaplex gel 12:08 PM

## 2013-06-26 NOTE — Telephone Encounter (Signed)
error 

## 2013-06-26 NOTE — Progress Notes (Signed)
[facility]    Radiation Oncology [address] [place]     [name], M.D. [place], [place] [address]               [name], M.D., Ph.D. Phone: [phone]      Molli Hazard A. Kathrynn Running, M.D. Fax: [phone]      Radene Gunning, M.D., Ph.D.         [name], M.D.         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 The chart was checked and MVCT  were reviewed. He is starting to have some fatigue. Also notices mild sore throat. The patient continues have numbness along his right face and jaw area.  He continues to have drainage from the right ear and uses a cotton ball most of the day to absorb this drainage.  Review of patient's allergies indicates no known allergies. Current Outpatient Prescriptions  Medication Sig Dispense Refill  . amLODipine-benazepril (LOTREL) 5-20 MG per capsule Take 1 tablet by mouth Daily.      [name] aspirin 81 MG tablet Take 81 mg by mouth daily.      [name] atenolol (TENORMIN) 50 MG tablet Take 50 mg by mouth daily.      . Coenzyme Q10 (CO Q 10) 100 MG CAPS Take 100 mg by mouth 2 (two) times daily.      [name] desoximetasone (TOPICORT) 0.25 % cream       . hyaluronate sodium (RADIAPLEXRX) GEL Apply topically 2 (two) times daily.      . hydrochlorothiazide (HYDRODIURIL) 25 MG tablet Take 12.5 mg by mouth Daily.      . Multiple Vitamins-Minerals (MULTIVITAMIN PO) Take 1 tablet by mouth daily.       . non-metallic deodorant Thornton Papas) MISC Apply 1 application topically daily as needed.      . Omega-3 Fatty Acids (FISH OIL) 1200 MG  CAPS Take 2 capsules by mouth 2 (two) times daily.      . silver sulfADIAZINE (SILVADENE) 1 % cream Apply topically 2 (two) times daily.      . sodium fluoride (PREVIDENT 5000 PLUS) 1.1 % CREA dental cream Apply thin ribbon of cream to tooth brush. Brush teeth for 2 minutes. Spit out excess-DO NOT swallow. DO NOT rinse afterwards. Repeat nightly.  1 Tube  PRN  . Alum & Mag Hydroxide-Simeth (MAGIC MOUTHWASH) SOLN Take 15 mLs by mouth QID.  473 mL  0  . HYDROcodone-acetaminophen (HYCET) 7.5-325 mg/15 ml solution Take 15 mLs by mouth every 4 (four) hours as needed for pain.  473 mL  0  . simvastatin (ZOCOR) 40 MG tablet Take 40 mg by mouth every evening.        No current facility-administered medications for this encounter.   Facility-Administered Medications Ordered in Other Encounters  Medication Dose Route Frequency Provider Last Rate Last Dose  . sodium chloride 0.9 % injection 10 mL  10 mL Intravenous Once [name], MD  Labs:  Lab Results  Component Value Date   WBC 4.9 06/07/2013   HGB 12.4* 06/07/2013   HCT 35.7* 06/07/2013   MCV 95.6 06/07/2013   PLT 174 06/07/2013   Lab Results  Component Value Date   CREATININE 0.9 06/07/2013   BUN 11.5 06/07/2013   NA 137 06/07/2013   K 4.1 06/07/2013   CL 101 06/07/2013   CO2 27 06/07/2013   No results found for this basename: ALT, AST, GGT, ALK, PHOS, BILITOT    Physical Examination:  vitals were not taken for this visit.   Wt Readings from Last 3 Encounters:  06/20/13 159 lb 12.8 oz (72.485 kg)  05/18/13 165 lb (74.844 kg)  03/16/13 173 lb 11.2 oz (78.79 kg)     Lungs - Normal respiratory effort, chest expands symmetrically. Lungs are clear to auscultation, no crackles or wheezes.  Heart has regular rhythm and rate  Abdomen is soft and non tender with normal bowel sounds  Assessment:  Patient tolerating treatments well except for above issues.  Plan: Continue treatment per original radiation prescription.  He has met with  the nutritionist concerning the dietary issues. I did give patient a prescription for Magic mouthwash and hydrocodone in  liquid form

## 2013-06-27 ENCOUNTER — Ambulatory Visit
Admission: RE | Admit: 2013-06-27 | Discharge: 2013-06-27 | Disposition: A | Discharge status: Home / Self Care | Payer: Medicare Other | Source: Ambulatory Visit | Attending: Radiation Oncology | Admitting: Radiation Oncology

## 2013-06-28 ENCOUNTER — Ambulatory Visit
Admission: RE | Admit: 2013-06-28 | Discharge: 2013-06-28 | Disposition: A | Discharge status: Home / Self Care | Payer: Medicare Other | Source: Ambulatory Visit | Attending: Radiation Oncology | Admitting: Radiation Oncology

## 2013-06-29 ENCOUNTER — Ambulatory Visit
Admission: RE | Admit: 2013-06-29 | Discharge: 2013-06-29 | Disposition: A | Discharge status: Home / Self Care | Payer: Medicare Other | Source: Ambulatory Visit | Attending: Radiation Oncology | Admitting: Radiation Oncology

## 2013-07-03 ENCOUNTER — Ambulatory Visit
Admission: RE | Admit: 2013-07-03 | Discharge: 2013-07-03 | Disposition: A | Discharge status: Home / Self Care | Payer: Medicare Other | Source: Ambulatory Visit | Attending: Radiation Oncology | Admitting: Radiation Oncology

## 2013-07-04 ENCOUNTER — Ambulatory Visit
Admission: RE | Admit: 2013-07-04 | Discharge: 2013-07-04 | Disposition: A | Discharge status: Home / Self Care | Payer: Medicare Other | Source: Ambulatory Visit | Attending: Radiation Oncology | Admitting: Radiation Oncology

## 2013-07-04 ENCOUNTER — Encounter: Payer: Self-pay | Admitting: Radiation Oncology

## 2013-07-04 VITALS — BP 90/64 | HR 57 | Temp 97.8°F | Resp 20 | Wt 155.5 lb

## 2013-07-04 DIAGNOSIS — C4490 Unspecified malignant neoplasm of skin, unspecified: Secondary | ICD-10-CM

## 2013-07-04 MED ORDER — PROCHLORPERAZINE MALEATE 10 MG PO TABS: 10.0000 mg | ORAL_TABLET | Freq: Four times a day (QID) | ORAL | Status: DC | PRN

## 2013-07-04 NOTE — Progress Notes (Signed)
Cory Serrano    Radiation Oncology [address] [place]     [name], M.D. [place], [place] [address]               [name], M.D., Ph.D. Phone: [phone]      Molli Hazard A. Kathrynn Running, M.D. Fax: [phone]      Radene Gunning, M.D., Ph.D.         [name], M.D.         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 The chart was checked and MVCT  were reviewed. He is having some mild problems with dizziness.[name] He also had some problems with nausea last week but this is better this week. I have given the patient prescription for Compazine for this issue. Patient also complains of the clalky taste his mouth and aversion to food.  have also recommended he increase his intake of nutritional supplements  Review of patient's allergies indicates no known allergies.  Current Outpatient Prescriptions  Medication Sig Dispense Refill  . amLODipine-benazepril (LOTREL) 5-20 MG per capsule Take 1 tablet by mouth Daily.      [name] aspirin 81 MG tablet Take 81 mg by mouth daily.      [name] atenolol (TENORMIN) 50 MG tablet Take 50 mg by mouth daily.      . Coenzyme Q10 (CO Q 10) 100 MG CAPS Take 100 mg by mouth 2 (two) times daily.      [name] desoximetasone (TOPICORT) 0.25 % cream       . hyaluronate sodium (RADIAPLEXRX) GEL Apply topically 2 (two) times daily.      . hydrochlorothiazide (HYDRODIURIL) 25 MG tablet Take 12.5 mg by mouth Daily.      [name] HYDROcodone-acetaminophen (HYCET) 7.5-325 mg/15 ml solution Take 15 mLs by mouth every 4 (four) hours as needed for  pain.  473 mL  0  . Multiple Vitamins-Minerals (MULTIVITAMIN PO) Take 1 tablet by mouth daily.       . non-metallic deodorant Thornton Papas) MISC Apply 1 application topically daily as needed.      . Omega-3 Fatty Acids (FISH OIL) 1200 MG CAPS Take 2 capsules by mouth 2 (two) times daily.      . silver sulfADIAZINE (SILVADENE) 1 % cream Apply topically 2 (two) times daily.      . simvastatin (ZOCOR) 40 MG tablet Take 40 mg by mouth every evening.       . sodium fluoride (PREVIDENT 5000 PLUS) 1.1 % CREA dental cream Apply thin ribbon of cream to tooth brush. Brush teeth for 2 minutes. Spit out excess-DO NOT swallow. DO NOT rinse afterwards. Repeat nightly.  1 Tube  PRN  . Alum & Mag Hydroxide-Simeth (MAGIC MOUTHWASH) SOLN Take 15 mLs by mouth QID.  473 mL  0  . prochlorperazine (COMPAZINE) 10 MG tablet Take 1 tablet (10 mg total) by mouth every 6 (six) hours as needed.  30 tablet  0   No  current facility-administered medications for this encounter.   Facility-Administered Medications Ordered in Other Encounters  Medication Dose Route Frequency Provider Last Rate Last Dose  . sodium chloride 0.9 % injection 10 mL  10 mL Intravenous Once [name], MD         Physical Examination:  weight is 155 lb 8 oz (70.534 kg). His oral temperature is 97.8 F (36.6 C). His blood pressure is 90/64 and his pulse is 57. His respiration is 20.    Wt Readings from Last 3 Encounters:  07/04/13 155 lb 8 oz (70.534 kg)  06/20/13 159 lb 12.8 oz (72.485 kg)  05/18/13 165 lb (74.844 kg)    The right neck and preauricular area shows some erythema. The preauricular mass on palpation seems to be slightly softer and smaller on exam today. The oral cavity shows thick tenacious sputum without secondary infection Lungs - Normal respiratory effort, chest expands symmetrically. Lungs are clear to auscultation, no crackles or wheezes.  Heart has regular rhythm and rate  Abdomen is soft and non tender with normal bowel  sounds  Assessment:  Patient tolerating treatments well except for above issues  Plan: Continue treatment per original radiation prescription

## 2013-07-04 NOTE — Progress Notes (Addendum)
Pt c/o chalky taste in mouth which makes it difficult to eat. He takes small bites, drinks liquids w/food. He is drinking 1 nutritional supplement daily, advised he try to drink 2 daily. He c/o dizziness, fatigue, weakness. Pt hypotensive today. Advised he call PCP today to discuss adjustment in BP meds of Atenolol and Amlodipine. Pt applying Radiaplex to right neck/ear treatment area.  Gave pt Aquafor for dry lips w/instructions to apply after radiation treatment. Pt states he had nausea last week following his radiation treatments, none this week. Advised he request script for nausea med if he develops this problem again. Pt verbalized understanding.

## 2013-07-05 ENCOUNTER — Ambulatory Visit
Admission: RE | Admit: 2013-07-05 | Discharge: 2013-07-05 | Disposition: A | Discharge status: Home / Self Care | Payer: Medicare Other | Source: Ambulatory Visit | Attending: Radiation Oncology | Admitting: Radiation Oncology

## 2013-07-06 ENCOUNTER — Ambulatory Visit
Admission: RE | Admit: 2013-07-06 | Discharge: 2013-07-06 | Disposition: A | Discharge status: Home / Self Care | Payer: Medicare Other | Source: Ambulatory Visit | Attending: Radiation Oncology | Admitting: Radiation Oncology

## 2013-07-07 ENCOUNTER — Ambulatory Visit
Admission: RE | Admit: 2013-07-07 | Discharge: 2013-07-07 | Disposition: A | Discharge status: Home / Self Care | Payer: Medicare Other | Source: Ambulatory Visit | Attending: Radiation Oncology | Admitting: Radiation Oncology

## 2013-07-10 ENCOUNTER — Ambulatory Visit
Admission: RE | Admit: 2013-07-10 | Discharge: 2013-07-10 | Disposition: A | Discharge status: Home / Self Care | Payer: Medicare Other | Source: Ambulatory Visit | Attending: Radiation Oncology | Admitting: Radiation Oncology

## 2013-07-11 ENCOUNTER — Encounter: Payer: Self-pay | Admitting: Radiation Oncology

## 2013-07-11 ENCOUNTER — Ambulatory Visit
Admission: RE | Admit: 2013-07-11 | Discharge: 2013-07-11 | Disposition: A | Discharge status: Home / Self Care | Payer: Medicare Other | Source: Ambulatory Visit | Attending: Radiation Oncology | Admitting: Radiation Oncology

## 2013-07-11 VITALS — BP 119/73 | HR 62 | Temp 98.0°F | Resp 20 | Wt 153.9 lb

## 2013-07-11 DIAGNOSIS — C4490 Unspecified malignant neoplasm of skin, unspecified: Secondary | ICD-10-CM

## 2013-07-11 NOTE — Progress Notes (Signed)
Pt denies pain, difficulty eating/swallowing. He takes small bites, chews thoroughly. He reports taste changes w/ loss of appetite, some nausea but has not taken Compazine for this. He is drinking Ensure 2 cans daily, advised he should add a 3rd can since he is eating less. Pt applying Radiaplex to right ear/neck for hyperpigmentation, some bleeding in crease in front of right ear. He states "this happens after radiation treatment due to having something put around his ear for treatment".

## 2013-07-11 NOTE — Progress Notes (Signed)
[facility]    Radiation Oncology [address] [place]     [name], M.D. [place], [place] [address]               [name], M.D., Ph.D. Phone: [phone]      Molli Hazard A. Kathrynn Running, M.D. Fax: [phone]      Radene Gunning, M.D., Ph.D.         [name], M.D.         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  Current Dose: 35.2 Gy  Current Fraction: 16  Planned Dose: 66 Gy  Narrative: Serrano Cory was seen today for weekly treatment management. The chart was checked and MVCT  were reviewed. He is tolerating the treatments well this time. He denies any pain along the right ear or preauricular area. He continues to have drainage from the upper aspect of his right ear.  He denies any drainage from the preauricular area.  Review of patient's allergies indicates no known allergies. Current Outpatient Prescriptions  Medication Sig Dispense Refill  . Alum & Mag Hydroxide-Simeth (MAGIC MOUTHWASH) SOLN Take 15 mLs by mouth QID.  473 mL  0  . amLODipine-benazepril (LOTREL) 5-20 MG per capsule Take 1 tablet by mouth Daily.      [name] aspirin 81 MG tablet Take 81 mg by mouth daily.      [name] atenolol (TENORMIN) 50 MG tablet Take 50 mg by mouth daily.      . Coenzyme Q10 (CO Q 10) 100 MG CAPS Take 100 mg by mouth 2 (two) times daily.      [name] desoximetasone (TOPICORT) 0.25 % cream       . hyaluronate sodium (RADIAPLEXRX) GEL Apply topically 2 (two) times daily.      . hydrochlorothiazide (HYDRODIURIL) 25 MG tablet Take 12.5 mg by mouth Daily.      [name] HYDROcodone-acetaminophen (HYCET) 7.5-325 mg/15 ml solution Take 15 mLs by mouth every 4 (four) hours as needed for pain.  473  mL  0  . Multiple Vitamins-Minerals (MULTIVITAMIN PO) Take 1 tablet by mouth daily.       . non-metallic deodorant Thornton Papas) MISC Apply 1 application topically daily as needed.      . Omega-3 Fatty Acids (FISH OIL) 1200 MG CAPS Take 2 capsules by mouth 2 (two) times daily.      . prochlorperazine (COMPAZINE) 10 MG tablet Take 1 tablet (10 mg total) by mouth every 6 (six) hours as needed.  30 tablet  0  . silver sulfADIAZINE (SILVADENE) 1 % cream Apply topically 2 (two) times daily.      . simvastatin (ZOCOR) 40 MG tablet Take 40 mg by mouth every evening.       . sodium fluoride (PREVIDENT 5000 PLUS) 1.1 % CREA dental cream Apply thin ribbon of cream to tooth brush. Brush teeth for 2 minutes. Spit out excess-DO NOT swallow. DO NOT rinse afterwards. Repeat nightly.  1 Tube  PRN   No current facility-administered medications for this encounter.   Facility-Administered Medications Ordered in Other Encounters  Medication Dose Route Frequency Provider Last  Rate Last Dose  . sodium chloride 0.9 % injection 10 mL  10 mL Intravenous Once [name], MD          Physical Examination:  weight is 153 lb 14.4 oz (69.809 kg). His oral temperature is 98 F (36.7 C). His blood pressure is 119/73 and his pulse is 62. His respiration is 20.    Wt Readings from Last 3 Encounters:  07/11/13 153 lb 14.4 oz (69.809 kg)  07/04/13 155 lb 8 oz (70.534 kg)  06/20/13 159 lb 12.8 oz (72.485 kg)    The right ear and preauricular area shows erythema consistent with radiation affect. The right preauricular mass has decreased in size when compared to pretreatment size.  Lungs - Normal respiratory effort, chest expands symmetrically. Lungs are clear to auscultation, no crackles or wheezes.  Heart has regular rhythm and rate  Abdomen is soft and non tender with normal bowel sounds  Assessment:  Patient tolerating treatments well  Plan: Continue treatment per original radiation prescription

## 2013-07-12 ENCOUNTER — Ambulatory Visit
Admission: RE | Admit: 2013-07-12 | Discharge: 2013-07-12 | Disposition: A | Discharge status: Home / Self Care | Payer: Medicare Other | Source: Ambulatory Visit | Attending: Radiation Oncology | Admitting: Radiation Oncology

## 2013-07-13 ENCOUNTER — Ambulatory Visit
Admission: RE | Admit: 2013-07-13 | Discharge: 2013-07-13 | Disposition: A | Discharge status: Home / Self Care | Payer: Medicare Other | Source: Ambulatory Visit | Attending: Radiation Oncology | Admitting: Radiation Oncology

## 2013-07-14 ENCOUNTER — Ambulatory Visit
Admission: RE | Admit: 2013-07-14 | Discharge: 2013-07-14 | Disposition: A | Discharge status: Home / Self Care | Payer: Medicare Other | Source: Ambulatory Visit | Attending: Radiation Oncology | Admitting: Radiation Oncology

## 2013-07-17 ENCOUNTER — Ambulatory Visit
Admission: RE | Admit: 2013-07-17 | Discharge: 2013-07-17 | Disposition: A | Discharge status: Home / Self Care | Payer: Medicare Other | Source: Ambulatory Visit | Attending: Radiation Oncology | Admitting: Radiation Oncology

## 2013-07-18 ENCOUNTER — Encounter: Payer: Self-pay | Admitting: Radiation Oncology

## 2013-07-18 ENCOUNTER — Ambulatory Visit
Admission: RE | Admit: 2013-07-18 | Discharge: 2013-07-18 | Disposition: A | Discharge status: Home / Self Care | Payer: Medicare Other | Source: Ambulatory Visit | Attending: Radiation Oncology | Admitting: Radiation Oncology

## 2013-07-18 VITALS — BP 141/77 | HR 61 | Temp 97.9°F | Resp 20 | Wt 154.7 lb

## 2013-07-18 DIAGNOSIS — C4490 Unspecified malignant neoplasm of skin, unspecified: Secondary | ICD-10-CM

## 2013-07-18 NOTE — Progress Notes (Signed)
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 He is applying Biafine cream to right ear/neck treatment area for bright hyperpigmentation. Pt states "food has no taste"; he is drinking Ensure 2 cans daily. Pt states nausea resolved. He is less fatigued this week. Pt denies sores in mouth, states "it still feels like I have a lump in my throat when I swallow and have a sore throat but nothing I can't handle".

## 2013-07-18 NOTE — Progress Notes (Signed)
[facility]    Radiation Oncology [address] [place]     [name], M.D. [place], [place] [address]               [name], M.D., Ph.D. Phone: [phone]      Molli Hazard A. Kathrynn Running, M.D. Fax: [phone]      Radene Gunning, M.D., Ph.D.         [name], M.D.         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    Current Dose: 46.2 Gy  Current Fraction: 21  Planned Dose: 66 Gy  Narrative: Cory Serrano was seen today for weekly treatment management. The chart was checked and MVCT  were reviewed.  He is tolerating his treatments reasonably well. He denies any actual pain along the right face or ear area. He continues to have drainage from the right ear. He has also noticed the some worsening of his hearing along the right side. He denies any significant soreness in his mouth.  Review of patient's allergies indicates no known allergies. Current Outpatient Prescriptions  Medication Sig Dispense Refill  . Alum & Mag Hydroxide-Simeth (MAGIC MOUTHWASH) SOLN Take 15 mLs by mouth QID.  473 mL  0  . aspirin 81 MG tablet Take 81 mg by mouth daily.      [name] atenolol (TENORMIN) 50 MG tablet Take 50 mg by mouth daily.      . Coenzyme Q10 (CO Q 10) 100 MG CAPS Take 100 mg by mouth 2 (two) times daily.      [name] desoximetasone (TOPICORT) 0.25 % cream       . hyaluronate sodium (RADIAPLEXRX) GEL Apply topically 2 (two) times daily.      [name] HYDROcodone-acetaminophen (HYCET) 7.5-325 mg/15 ml solution Take 15 mLs by mouth every 4 (four) hours as needed for pain.  473 mL  0  . Multiple Vitamins-Minerals (MULTIVITAMIN PO) Take 1 tablet by mouth daily.       . non-metallic deodorant  Thornton Papas) MISC Apply 1 application topically daily as needed.      . Omega-3 Fatty Acids (FISH OIL) 1200 MG CAPS Take 2 capsules by mouth 2 (two) times daily.      . prochlorperazine (COMPAZINE) 10 MG tablet Take 1 tablet (10 mg total) by mouth every 6 (six) hours as needed.  30 tablet  0  . silver sulfADIAZINE (SILVADENE) 1 % cream Apply topically 2 (two) times daily.      . simvastatin (ZOCOR) 40 MG tablet Take 40 mg by mouth every evening.       . sodium fluoride (PREVIDENT 5000 PLUS) 1.1 % CREA dental cream Apply thin ribbon of cream to tooth brush. Brush teeth for 2 minutes. Spit out excess-DO NOT swallow. DO NOT rinse afterwards. Repeat nightly.  1 Tube  PRN  . amLODipine-benazepril (LOTREL) 5-20 MG per capsule Take 1 tablet by mouth Daily.      . hydrochlorothiazide (HYDRODIURIL) 25 MG tablet Take 12.5 mg by mouth Daily.       No current facility-administered medications for this encounter.   Facility-Administered  Medications Ordered in Other Encounters  Medication Dose Route Frequency Provider Last Rate Last Dose  . sodium chloride 0.9 % injection 10 mL  10 mL Intravenous Once [name], MD       Labs:  Lab Results  Component Value Date   WBC 4.9 06/07/2013   HGB 12.4* 06/07/2013   HCT 35.7* 06/07/2013   MCV 95.6 06/07/2013   PLT 174 06/07/2013   Lab Results  Component Value Date   CREATININE 0.9 06/07/2013   BUN 11.5 06/07/2013   NA 137 06/07/2013   K 4.1 06/07/2013   CL 101 06/07/2013   CO2 27 06/07/2013   No results found for this basename: ALT, AST, GGT, ALK, PHOS, BILITOT    Physical Examination:  weight is 154 lb 11.2 oz (70.171 kg). His oral temperature is 97.9 F (36.6 C). His blood pressure is 141/77 and his pulse is 61. His respiration is 20.    Wt Readings from Last 3 Encounters:  07/18/13 154 lb 11.2 oz (70.171 kg)  07/11/13 153 lb 14.4 oz (69.809 kg)  07/04/13 155 lb 8 oz (70.534 kg)   The oral cavity is moist without secondary infection The right ear and  preauricular area shows brisk erythema with some dry desquamation. There continues to be yellowish green drainage from the right upper ear and external canal Lungs - Normal respiratory effort, chest expands symmetrically. Lungs are clear to auscultation, no crackles or wheezes.  Heart has regular rhythm and rate  Abdomen is soft and non tender with normal bowel sounds  Assessment:  Patient tolerating treatments well except for issues as above  Plan: Continue treatment per original radiation prescription

## 2013-07-19 ENCOUNTER — Ambulatory Visit
Admission: RE | Admit: 2013-07-19 | Discharge: 2013-07-19 | Disposition: A | Discharge status: Home / Self Care | Payer: Medicare Other | Source: Ambulatory Visit | Attending: Radiation Oncology | Admitting: Radiation Oncology

## 2013-07-20 ENCOUNTER — Ambulatory Visit
Admission: RE | Admit: 2013-07-20 | Discharge: 2013-07-20 | Disposition: A | Discharge status: Home / Self Care | Payer: Medicare Other | Source: Ambulatory Visit | Attending: Radiation Oncology | Admitting: Radiation Oncology

## 2013-07-21 ENCOUNTER — Ambulatory Visit
Admission: RE | Admit: 2013-07-21 | Discharge: 2013-07-21 | Disposition: A | Discharge status: Home / Self Care | Payer: Medicare Other | Source: Ambulatory Visit | Attending: Radiation Oncology | Admitting: Radiation Oncology

## 2013-07-24 ENCOUNTER — Ambulatory Visit
Admission: RE | Admit: 2013-07-24 | Discharge: 2013-07-24 | Disposition: A | Discharge status: Home / Self Care | Payer: Medicare Other | Source: Ambulatory Visit | Attending: Radiation Oncology | Admitting: Radiation Oncology

## 2013-07-25 ENCOUNTER — Ambulatory Visit
Admission: RE | Admit: 2013-07-25 | Discharge: 2013-07-25 | Disposition: A | Discharge status: Home / Self Care | Payer: Medicare Other | Source: Ambulatory Visit | Attending: Radiation Oncology | Admitting: Radiation Oncology

## 2013-07-25 VITALS — Wt 153.5 lb

## 2013-07-25 DIAGNOSIS — C801 Malignant (primary) neoplasm, unspecified: Secondary | ICD-10-CM

## 2013-07-25 NOTE — Progress Notes (Addendum)
Weekly rad txs, H/N, rt, 26/30, bright erythema,dry desquamation, and scabbed areas, using silvadeen bid, rope like saliva, no tasrte, difficulty swallowing, had 2 vegetables for lunch yesterday, (pinto beans, carrot raison salad), drinks juices, coffee, and ensure cans 2 daily, slight nausea, not taking compazine, only taking 1 b/p med, no dizziness today, some fatigue, using the dental cream, using rinse with water in am and water pic

## 2013-07-25 NOTE — Progress Notes (Signed)
[facility]    Radiation Oncology [address] [place]     Cory Serrano, M.D. [name], [place] [address]               Cory Serrano, M.D., Ph.D. Phone: [phone]      Cory Serrano A. Kathrynn Serrano, M.D. Fax: [phone]      Cory Serrano, M.D., Ph.D.         Cory Serrano, M.D.         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 The chart was checked and MVCT  were reviewed. The patient has noticed less drainage from his right ear at this time. He does have dry mouth and poor taste. He is advised them to increase his supplements to 3 cans per day.  He is using Silvadene along his treatment area  Review of patient's allergies indicates no known allergies. Current Outpatient Prescriptions  Medication Sig Dispense Refill  . aspirin 81 MG tablet Take 81 mg by mouth daily.      [name] atenolol (TENORMIN) 50 MG tablet Take 50 mg by mouth daily.      . Coenzyme Q10 (CO Q 10) 100 MG CAPS Take 100 mg by mouth 2 (two) times daily.      [name] desoximetasone (TOPICORT) 0.25 % cream       . hyaluronate sodium (RADIAPLEXRX) GEL Apply topically 2 (two) times daily.      . Multiple Vitamins-Minerals (MULTIVITAMIN PO) Take 1 tablet by mouth daily.       . silver sulfADIAZINE (SILVADENE) 1 % cream Apply topically 2 (two) times daily.      . simvastatin (ZOCOR) 40 MG tablet Take 40 mg by mouth every evening.       . sodium fluoride (PREVIDENT 5000 PLUS) 1.1 % CREA dental cream Apply thin ribbon of cream to tooth brush. Brush teeth for 2 minutes. Spit out excess-DO  NOT swallow. DO NOT rinse afterwards. Repeat nightly.  1 Tube  PRN  . Alum & Mag Hydroxide-Simeth (MAGIC MOUTHWASH) SOLN Take 15 mLs by mouth QID.  473 mL  0  . amLODipine-benazepril (LOTREL) 5-20 MG per capsule Take 1 tablet by mouth Daily.      . hydrochlorothiazide (HYDRODIURIL) 25 MG tablet Take 12.5 mg by mouth Daily.      [name] HYDROcodone-acetaminophen (HYCET) 7.5-325 mg/15 ml solution Take 15 mLs by mouth every 4 (four) hours as needed for pain.  473 mL  0  . non-metallic deodorant (ALRA) MISC Apply 1 application topically daily as needed.      . Omega-3 Fatty Acids (FISH OIL) 1200 MG CAPS Take 2 capsules by mouth 2 (two) times daily.      . prochlorperazine (COMPAZINE) 10 MG tablet Take 1 tablet (10 mg total) by mouth every 6 (six) hours as needed.  30 tablet  0   No current facility-administered medications for this encounter.   Facility-Administered Medications Ordered in Other Encounters  Medication Dose Route  Frequency Provider Last Rate Last Dose  . sodium chloride 0.9 % injection 10 mL  10 mL Intravenous Once Cory Lade, MD       Labs:  Lab Results  Component Value Date   WBC 4.9 06/07/2013   HGB 12.4* 06/07/2013   HCT 35.7* 06/07/2013   MCV 95.6 06/07/2013   PLT 174 06/07/2013   Lab Results  Component Value Date   CREATININE 0.9 06/07/2013   BUN 11.5 06/07/2013   NA 137 06/07/2013   K 4.1 06/07/2013   CL 101 06/07/2013   CO2 27 06/07/2013   No results found for this basename: ALT, AST, GGT, ALK, PHOS, BILITOT    Physical Examination:  weight is 153 lb 8 oz (69.627 kg).    Wt Readings from Last 3 Encounters:  07/25/13 153 lb 8 oz (69.627 kg)  07/18/13 154 lb 11.2 oz (70.171 kg)  07/11/13 153 lb 14.4 oz (69.809 kg)   The oral cavity is dry without secondary infection. The right ear preauricular and upper neck area shows erythema and dry desquamation there is yellowish drainage from the right ear region Lungs - Normal respiratory effort, chest expands symmetrically.  Lungs are clear to auscultation, no crackles or wheezes.  Heart has regular rhythm and rate  Abdomen is soft and non tender with normal bowel sounds  Assessment:  Patient tolerating treatments well except for issues as above  Plan: Continue treatment per original radiation prescription

## 2013-07-26 ENCOUNTER — Ambulatory Visit
Admission: RE | Admit: 2013-07-26 | Discharge: 2013-07-26 | Disposition: A | Discharge status: Home / Self Care | Payer: Medicare Other | Source: Ambulatory Visit | Attending: Radiation Oncology | Admitting: Radiation Oncology

## 2013-07-27 ENCOUNTER — Ambulatory Visit
Admission: RE | Admit: 2013-07-27 | Discharge: 2013-07-27 | Disposition: A | Discharge status: Home / Self Care | Payer: Medicare Other | Source: Ambulatory Visit | Attending: Radiation Oncology | Admitting: Radiation Oncology

## 2013-07-28 ENCOUNTER — Ambulatory Visit
Admission: RE | Admit: 2013-07-28 | Discharge: 2013-07-28 | Disposition: A | Discharge status: Home / Self Care | Payer: Medicare Other | Source: Ambulatory Visit | Attending: Radiation Oncology | Admitting: Radiation Oncology

## 2013-07-31 ENCOUNTER — Ambulatory Visit
Admission: RE | Admit: 2013-07-31 | Discharge: 2013-07-31 | Disposition: A | Discharge status: Home / Self Care | Payer: Medicare Other | Source: Ambulatory Visit | Attending: Radiation Oncology | Admitting: Radiation Oncology

## 2013-07-31 ENCOUNTER — Encounter: Payer: Self-pay | Admitting: Radiation Oncology

## 2013-07-31 VITALS — BP 144/75 | HR 68 | Temp 97.6°F | Resp 20 | Wt 151.2 lb

## 2013-07-31 DIAGNOSIS — C4490 Unspecified malignant neoplasm of skin, unspecified: Secondary | ICD-10-CM

## 2013-07-31 NOTE — Progress Notes (Signed)
Pt completed radiation treatment today to right ear/neck. He continues to apply Silvadene to this area once daily, applies Biafine nightly. He has moist desquamation in his right neck area, no odor noted, slight light yellow drainage. Pt's right ear and cheek hyperpigmented, dry desquamation, no drainage noted, ear is swollen; pt states "it feels plugged".  Pt fatigued w/loss of taste buds. He is drinking Ensure 2 dans daily, eating soft foods. He reports dry mouth, sore throat w/swallowing but has not had to use Hycet for the pain.  Gave pt 1 month FU card.

## 2013-07-31 NOTE — Progress Notes (Signed)
   Weekly Management Note  outpatient  Completed Radiotherapy. Total Dose:  66 Gy   Narrative:  The patient presents for routine under treatment assessment on last day of radiotherapy.  CBCT/MVCT images/Port film x-rays were reviewed.  The chart was checked.  R  ear is "plugged" with hearing loss.  Skin irritated, applying Silvadene.  Physical Findings:  weight is 151 lb 3.2 oz (68.584 kg). His oral temperature is 97.6 F (36.4 C). His blood pressure is 144/75 and his pulse is 68. His respiration is 20.   Moist desquamation over lower right neck - skin is dry/peeling/erythematous throughout ear and rest of right neck.  Impression:  The patient has tolerated radiotherapy.  Plan:  Routine follow-up in one month.  ________________________________   Lonie Peak, M.D.

## 2013-08-01 ENCOUNTER — Ambulatory Visit: Payer: Medicare Other

## 2013-08-02 ENCOUNTER — Ambulatory Visit: Payer: Medicare Other

## 2013-08-09 ENCOUNTER — Encounter: Payer: Self-pay | Admitting: Radiation Oncology

## 2013-08-09 NOTE — Progress Notes (Signed)
  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  He did experience dry mouth, sore throat and poor taste. He also developed moist desquamation in the treatment area which was treated with Silvadene. He did notice less drainage from the right ear at the completion of his therapy and less pain in this region.  Plan: The patient has completed radiation treatment. The patient will return to radiation oncology clinic for routine followup in one month. I advised them to call or return sooner if they have any questions or concerns related to their recovery or treatment.  -----------------------------------  [name], PhD, MD

## 2013-08-18 ENCOUNTER — Telehealth: Payer: Self-pay | Admitting: Dietician

## 2013-08-18 NOTE — Telephone Encounter (Signed)
Brief Outpatient Oncology Nutrition Note  Patient has been identified to be at risk on malnutrition screen.  Wt Readings from Last 10 Encounters:  07/31/13 151 lb 3.2 oz (68.584 kg)  07/25/13 153 lb 8 oz (69.627 kg)  07/18/13 154 lb 11.2 oz (70.171 kg)  07/11/13 153 lb 14.4 oz (69.809 kg)  07/04/13 155 lb 8 oz (70.534 kg)  06/20/13 159 lb 12.8 oz (72.485 kg)  05/18/13 165 lb (74.844 kg)  03/16/13 173 lb 11.2 oz (78.79 kg)  01/12/13 176 lb 9.6 oz (80.105 kg)  12/13/12 177 lb 3.2 oz (80.377 kg)    Patient with neck cancer who finished radiation treatment approximately 3 weeks ago.  Still reports a decreased appetite and decreased taste but is eating a little better and continues to drink Ensure daily.  [facility] 06/20/13.  Encouraged patient to call if any questions.  Reviewed need to continue to increase intake and work to gain weight.  Oran Rein, RD, LDN

## 2013-08-25 ENCOUNTER — Encounter: Payer: Self-pay | Admitting: Radiation Oncology

## 2013-08-31 ENCOUNTER — Ambulatory Visit
Admission: RE | Admit: 2013-08-31 | Discharge: 2013-08-31 | Disposition: A | Discharge status: Home / Self Care | Payer: Medicare Other | Source: Ambulatory Visit | Attending: Radiation Oncology | Admitting: Radiation Oncology

## 2013-08-31 ENCOUNTER — Telehealth: Payer: Self-pay | Admitting: *Deleted

## 2013-08-31 ENCOUNTER — Encounter: Payer: Self-pay | Admitting: Radiation Oncology

## 2013-08-31 VITALS — BP 144/79 | HR 59 | Temp 97.2°F | Resp 20 | Wt 147.1 lb

## 2013-08-31 DIAGNOSIS — C4490 Unspecified malignant neoplasm of skin, unspecified: Secondary | ICD-10-CM

## 2013-08-31 MED ORDER — PILOCARPINE HCL 5 MG PO TABS: 5.0000 mg | ORAL_TABLET | Freq: Three times a day (TID) | ORAL | Status: DC

## 2013-08-31 NOTE — Progress Notes (Signed)
Pt denies pain, fatigue, states appetite returning. Reports very dry mouth. Pt's right ear, neck area healed, with some pinkness of skin. Pt applying lotion to this area. Pt c/o "hearing an echo" in his right ear and "feeling like there's something inside my ear canal. Pt has not seen Dr [name] since completing radiation.

## 2013-08-31 NOTE — Progress Notes (Signed)
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  He seems to be doing reasonably well. He denies any pain along the right here or face region. He has improvement in his trismus. He he denies any drainage from his right ear or bleeding. He continues have numbness along the right lower face region. He is not taking any pain medication at this time. He does complain of hearing difficulties and complaints he hears an echo out of his right ear.                              ALLERGIES:  has No Known Allergies.  Meds: Current Outpatient Prescriptions  Medication Sig Dispense Refill  . amLODipine-benazepril (LOTREL) 5-20 MG per capsule Take 1 tablet by mouth Daily.      [name] aspirin 81 MG tablet Take 81 mg by mouth daily.      [name] atenolol (TENORMIN) 50 MG tablet Take 50 mg by mouth daily.      . Coenzyme Q10 (CO Q 10) 100 MG CAPS Take 100 mg by mouth 2 (two) times daily.      [name] desoximetasone (TOPICORT) 0.25 % cream       . hyaluronate sodium (RADIAPLEXRX) GEL Apply topically 2 (two) times daily.      . hydrochlorothiazide (HYDRODIURIL) 25 MG tablet Take 12.5 mg by mouth Daily.      [name] HYDROcodone-acetaminophen (HYCET) 7.5-325 mg/15 ml solution Take 15 mLs by mouth every 4 (four) hours as needed for pain.  473 mL  0  . Multiple Vitamins-Minerals (MULTIVITAMIN PO) Take 1 tablet by mouth daily.       . Omega-3 Fatty Acids (FISH OIL) 1200 MG CAPS Take 2 capsules by mouth 2 (two) times daily.      . prochlorperazine (COMPAZINE) 10 MG tablet Take 1 tablet (10 mg total) by mouth every 6 (six) hours as needed.  30  tablet  0  . simvastatin (ZOCOR) 40 MG tablet Take 40 mg by mouth every evening.       . sodium fluoride (PREVIDENT 5000 PLUS) 1.1 % CREA dental cream Apply thin ribbon of cream to tooth brush. Brush teeth for 2 minutes. Spit out excess-DO NOT swallow. DO NOT rinse afterwards. Repeat nightly.  1 Tube  PRN  . pilocarpine (SALAGEN) 5 MG tablet Take 1 tablet (5 mg total) by mouth 3 (three) times daily.  90 tablet  2   No current facility-administered medications for this encounter.   Facility-Administered Medications Ordered in Other Encounters  Medication Dose Route Frequency Provider Last Rate Last Dose  . sodium chloride 0.9 % injection 10 mL  10 mL Intravenous Once [name], MD        Physical Findings: The patient is in no acute distress. Patient is alert and oriented.  weight is 147 lb 1.6 oz (66.724 kg). His temperature is 97.2 F (36.2 C). His blood pressure is 144/79 and his pulse is 59. His respiration is 20. [name]  No palpable supraclavicular or cervical adenopathy. The preauricular  areas are free of adenopathy. The patient's skin is well healed along the right face and ear area.  the right external ear canal is significantly narrowed and I was unable to obtain a few of the tympanic membrane. He has much less pain with manipulation of the right ear.  Lab Findings: Lab Results  Component Value Date   WBC 4.9 06/07/2013   HGB 12.4* 06/07/2013   HCT 35.7* 06/07/2013   MCV 95.6 06/07/2013   PLT 174 06/07/2013      Radiographic Findings: No results found.  Impression:  The patient is recovering from the effects of radiation.  He seems to have had a good response to his radiation therapy thus far.  Plan:  I have recommended he followup with Dr. [name] concerning his hearing issues. Patient return for routine followup in 2 months. I may consider a CT scan or PET scan at that time.  The patient will be given a prescription for Salagen to try for his  xerostomia.  _____________________________________  -----------------------------------  [name], PhD, MD

## 2013-08-31 NOTE — Telephone Encounter (Signed)
Left vm informing pt Dr Roselind Messier sent prescription for Salagen to pt's pharmacy. Left call back name and number for questions.

## 2013-09-12 ENCOUNTER — Encounter (HOSPITAL_COMMUNITY): Payer: Self-pay | Admitting: Pharmacy Technician

## 2013-09-12 ENCOUNTER — Other Ambulatory Visit: Payer: Self-pay | Admitting: Otolaryngology

## 2013-09-13 ENCOUNTER — Encounter (HOSPITAL_COMMUNITY)
Admission: RE | Admit: 2013-09-13 | Discharge: 2013-09-13 | Disposition: A | Discharge status: Home / Self Care | Payer: Medicare Other | Source: Ambulatory Visit | Attending: Otolaryngology | Admitting: Otolaryngology

## 2013-09-13 ENCOUNTER — Encounter (HOSPITAL_COMMUNITY): Payer: Self-pay

## 2013-09-13 HISTORY — DX: Other specified postprocedural states: Z98.890

## 2013-09-13 LAB — BASIC METABOLIC PANEL
CO2: 27 mEq/L (ref 19–32)
Calcium: 9.3 mg/dL (ref 8.4–10.5)
Creatinine, Ser: 0.82 mg/dL (ref 0.50–1.35)
Glucose, Bld: 117 mg/dL — ABNORMAL HIGH (ref 70–99)

## 2013-09-13 LAB — CBC
MCH: 32.2 pg (ref 26.0–34.0)
MCHC: 34 g/dL (ref 30.0–36.0)
MCV: 94.8 fL (ref 78.0–100.0)
Platelets: 107 10*3/uL — ABNORMAL LOW (ref 150–400)
RDW: 15 % (ref 11.5–15.5)

## 2013-09-13 NOTE — Progress Notes (Signed)
Denies seeing a cardiologist.  PCP is Dr. Ivery Quale Denies having an echo or card cath. Stress test noted in epic from 2013 Ekg in epic form 09-30-12 cxray in epic form 09-23-12.  Voices understanding of pre-admit orders. Dr. [name] office called this am and asked to release the orders.

## 2013-09-13 NOTE — Pre-Procedure Instructions (Addendum)
Cory Serrano  09/13/2013   Your procedure is scheduled on: Sept 18 @0730   Report to [facility] at 0530 AM.  Call this number if you have problems the morning of surgery: [phone]   Remember:   Do not eat food or drink liquids after midnight.   Take these medicines the morning of surgery with A SIP OF WATER: Atenolol (Tenormin), and Salagen  (Pilocarpine)        Stop taking Aspirin, Aleve, Ibuprofen, BC's, Goody's, Fish oil, or Herbal medications.                                 Do not wear jewelry, make-up or nail polish.  Do not wear lotions, powders, or perfumes. You may wear deodorant.  Do not shave 48 hours prior to surgery. Men may shave face and neck.  Do not bring valuables to the hospital.  [facility] is not responsible                   for any belongings or valuables.  Contacts, dentures or bridgework may not be worn into surgery.  Leave suitcase in the car. After surgery it may be brought to your room.  For patients admitted to the hospital, checkout time is 11:00 AM the day of  discharge.   Patients discharged the day of surgery will not be allowed to drive  home.    Special Instructions: Shower using CHG 2 nights before surgery and the night before surgery.  If you shower the day of surgery use CHG.  Use special wash - you have one bottle of CHG for all showers.  You should use approximately 1/3 of the bottle for each shower.   Please read over the following fact sheets that you were given: Pain Booklet, Coughing and Deep Breathing and Surgical Site Infection Prevention

## 2013-09-14 ENCOUNTER — Encounter (HOSPITAL_COMMUNITY): Payer: Self-pay | Admitting: Anesthesiology

## 2013-09-14 ENCOUNTER — Encounter (HOSPITAL_COMMUNITY)
Admission: RE | Disposition: A | Discharge status: Home / Self Care | Payer: Self-pay | Source: Ambulatory Visit | Attending: Otolaryngology

## 2013-09-14 ENCOUNTER — Ambulatory Visit (HOSPITAL_COMMUNITY): Payer: Medicare Other | Admitting: Anesthesiology

## 2013-09-14 ENCOUNTER — Ambulatory Visit (HOSPITAL_COMMUNITY)
Admission: RE | Admit: 2013-09-14 | Discharge: 2013-09-14 | Disposition: A | Discharge status: Home / Self Care | Payer: Medicare Other | Source: Ambulatory Visit | Attending: Otolaryngology | Admitting: Otolaryngology

## 2013-09-14 DIAGNOSIS — Z85830 Personal history of malignant neoplasm of bone: Secondary | ICD-10-CM | POA: Insufficient documentation

## 2013-09-14 DIAGNOSIS — Z01812 Encounter for preprocedural laboratory examination: Secondary | ICD-10-CM | POA: Insufficient documentation

## 2013-09-14 DIAGNOSIS — I10 Essential (primary) hypertension: Secondary | ICD-10-CM | POA: Insufficient documentation

## 2013-09-14 DIAGNOSIS — H6590 Unspecified nonsuppurative otitis media, unspecified ear: Secondary | ICD-10-CM | POA: Insufficient documentation

## 2013-09-14 HISTORY — PX: MYRINGOTOMY WITH TUBE PLACEMENT: SHX5663

## 2013-09-14 SURGERY — MYRINGOTOMY WITH TUBE PLACEMENT
Anesthesia: General | Site: Eye | Laterality: Right | Wound class: Clean

## 2013-09-14 MED ORDER — CIPROFLOXACIN-DEXAMETHASONE 0.3-0.1 % OT SUSP
OTIC | Status: AC
  Filled 2013-09-14: qty 7.5

## 2013-09-14 MED ORDER — HYDROMORPHONE HCL PF 1 MG/ML IJ SOLN: 0.2500 mg | INTRAMUSCULAR | Status: DC | PRN

## 2013-09-14 MED ORDER — ONDANSETRON HCL 4 MG/2ML IJ SOLN: 4.0000 mg | Freq: Once | INTRAMUSCULAR | Status: DC | PRN

## 2013-09-14 MED ORDER — LACTATED RINGERS IV SOLN
INTRAVENOUS | Status: DC | PRN
  Administered 2013-09-14: 07:00:00 via INTRAVENOUS

## 2013-09-14 MED ORDER — PROPOFOL 10 MG/ML IV BOLUS
INTRAVENOUS | Status: DC | PRN
  Administered 2013-09-14: 150 mg via INTRAVENOUS

## 2013-09-14 MED ORDER — CIPROFLOXACIN-DEXAMETHASONE 0.3-0.1 % OT SUSP
OTIC | Status: DC | PRN
  Administered 2013-09-14: 4 [drp] via OTIC

## 2013-09-14 MED ORDER — CIPROFLOXACIN-DEXAMETHASONE 0.3-0.1 % OT SUSP: 4.0000 [drp] | Freq: Two times a day (BID) | OTIC | Status: DC

## 2013-09-14 MED ORDER — FENTANYL CITRATE 0.05 MG/ML IJ SOLN
INTRAMUSCULAR | Status: DC | PRN
  Administered 2013-09-14: 50 ug via INTRAVENOUS

## 2013-09-14 MED ORDER — LIDOCAINE HCL (CARDIAC) 20 MG/ML IV SOLN
INTRAVENOUS | Status: DC | PRN
  Administered 2013-09-14: 50 mg via INTRAVENOUS

## 2013-09-14 SURGICAL SUPPLY — 19 items
BALL CTTN LRG ABS STRL LF (GAUZE/BANDAGES/DRESSINGS) ×1
BLADE MYRINGOTOMY 6 SPEAR HDL (BLADE) ×2 IMPLANT
CANISTER SUCTION 2500CC (MISCELLANEOUS) ×2 IMPLANT
CLOTH BEACON ORANGE TIMEOUT ST (SAFETY) ×2 IMPLANT
COTTONBALL LRG STERILE PKG (GAUZE/BANDAGES/DRESSINGS) ×1 IMPLANT
COVER MAYO STAND STRL (DRAPES) ×1 IMPLANT
CRADLE DONUT ADULT HEAD (MISCELLANEOUS) IMPLANT
DRAPE PROXIMA HALF (DRAPES) ×2 IMPLANT
GLOVE ECLIPSE 7.5 STRL STRAW (GLOVE) ×2 IMPLANT
GLOVE SURG SS PI 7.0 STRL IVOR (GLOVE) ×1 IMPLANT
KIT ROOM TURNOVER OR (KITS) ×2 IMPLANT
PAD ARMBOARD 7.5X6 YLW CONV (MISCELLANEOUS) ×4 IMPLANT
SYR BULB 3OZ (MISCELLANEOUS) ×2 IMPLANT
TOWEL OR 17X24 6PK STRL BLUE (TOWEL DISPOSABLE) ×2 IMPLANT
TUBE CONNECTING 12X1/4 (SUCTIONS) ×2 IMPLANT
TUBE EAR DONALDSON SIL 1.14 (OTOLOGIC RELATED) IMPLANT
TUBE EAR SHEEHY BUTTON 1.27 (OTOLOGIC RELATED) IMPLANT
TUBE EAR VENT ACTIVENT 1.14 (OTOLOGIC RELATED) ×4 IMPLANT
WATER STERILE IRR 1000ML POUR (IV SOLUTION) ×2 IMPLANT

## 2013-09-14 NOTE — Anesthesia Postprocedure Evaluation (Signed)
  Anesthesia Post-op Note  Patient: Cory Serrano  Procedure(s) Performed: Procedure(s): MYRINGOTOMY WITH TUBE PLACEMENT (Right)  Patient Location: PACU  Anesthesia Type:General  Level of Consciousness: awake, oriented, sedated and patient cooperative  Airway and Oxygen Therapy: Patient Spontanous Breathing  Post-op Pain: none  Post-op Assessment: Post-op Vital signs reviewed, Patient's Cardiovascular Status Stable, Respiratory Function Stable, Patent Airway, No signs of Nausea or vomiting and Pain level controlled  Post-op Vital Signs: stable  Complications: No apparent anesthesia complications

## 2013-09-14 NOTE — Anesthesia Preprocedure Evaluation (Signed)
Anesthesia Evaluation  Patient identified by MRN, date of birth, ID band Patient awake    Reviewed: Allergy & Precautions, H&P , NPO status , Patient's Chart, lab work & pertinent test results  Airway       Dental   Pulmonary          Cardiovascular hypertension,     Neuro/Psych    GI/Hepatic   Endo/Other    Renal/GU      Musculoskeletal   Abdominal   Peds  Hematology   Anesthesia Other Findings CA Throat Radical neck with radiation Rx  Reproductive/Obstetrics                           Anesthesia Physical Anesthesia Plan  ASA: II  Anesthesia Plan: General   Post-op Pain Management:    Induction: Intravenous  Airway Management Planned: LMA and Mask  Additional Equipment:   Intra-op Plan:   Post-operative Plan: Extubation in OR  Informed Consent: I have reviewed the patients History and Physical, chart, labs and discussed the procedure including the risks, benefits and alternatives for the proposed anesthesia with the patient or authorized representative who has indicated his/her understanding and acceptance.     Plan Discussed with: CRNA, Anesthesiologist and Surgeon  Anesthesia Plan Comments:         Anesthesia Quick Evaluation

## 2013-09-14 NOTE — Preoperative (Signed)
Beta Blockers   Reason not to administer Beta Blockers:Not Applicable 

## 2013-09-14 NOTE — H&P (Signed)
Cory Serrano is an 78 y.o. male.   Chief Complaint: right sided effusion here for tube HPI: he has right ear effusion and needs tube in OR  Past Medical History  Diagnosis Date  . Hyperlipidemia   . BPH (benign prostatic hyperplasia)   . Cardiomyopathy   . Obstructive uropathy   . Bradycardia   . 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 Arthritis     hands per patient  . Cataract     beginning of  . Hx of radiation therapy 10/27/12 -12/15/12    left neck  . Hx of radiation therapy 6/23//14- 07/31/13    R periauricular area, ear, temporal bone, 66 gray 30 fx  . Hypertension   . H/O prostate biopsy     was neg    Past Surgical History  Procedure Laterality Date  . Tonsillectomy    . Hernia repair  8-9 years ago    right inguinal  . Wisdom tooth extraction    . Cardiovascular stress test  2013    copy in [facility]  . Colonoscopy    . Prostate biopsy       neg. 7-8 years ago    Family History  Problem Relation Age of Onset  . Cancer Mother     colon   Social History:  reports that he quit smoking about 46 years ago. His smoking use included Cigarettes. He has a 15 pack-year smoking history. He has never used smokeless tobacco. He reports that he drinks about 9.0 ounces of alcohol per week. He reports that he does not use illicit drugs.  Allergies: No Known Allergies  Medications Prior to Admission  Medication Sig Dispense Refill  . aspirin 81 MG tablet Take 81 mg by mouth daily.      [name] atenolol (TENORMIN) 50 MG tablet Take 50 mg by mouth daily.      . Coenzyme Q10 (CO Q 10) 100 MG CAPS Take 100 mg by mouth 2 (two) times daily.      . pilocarpine (SALAGEN) 5 MG tablet Take 1 tablet (5 mg total) by mouth 3 (three) times daily.  90 tablet  2  . simvastatin (ZOCOR) 40 MG tablet Take 40 mg by mouth every evening.         Results for orders placed during the hospital encounter of 09/13/13 (from the past  48 hour(s))  BASIC METABOLIC PANEL     Status: Abnormal   Collection Time    09/13/13  3:35 PM      Result Value Range   Sodium 137  135 - 145 mEq/L   Potassium 4.3  3.5 - 5.1 mEq/L   Chloride 103  96 - 112 mEq/L   CO2 27  19 - 32 mEq/L   Glucose, Bld 117 (*) 70 - 99 mg/dL   BUN 14  6 - 23 mg/dL   Creatinine, Ser 2.13  0.50 - 1.35 mg/dL   Calcium 9.3  8.4 - 08.6 mg/dL   GFR calc non Af Amer 84 (*) >90 mL/min   GFR calc Af Amer >90  >90 mL/min   Comment: (NOTE)     The eGFR has been calculated using the CKD EPI equation.     This calculation has not been validated in all clinical situations.     eGFR's persistently <90 mL/min signify possible Chronic Kidney  Disease.  CBC     Status: Abnormal   Collection Time    09/13/13  3:35 PM      Result Value Range   WBC 3.4 (*) 4.0 - 10.5 K/uL   RBC 3.63 (*) 4.22 - 5.81 MIL/uL   Hemoglobin 11.7 (*) 13.0 - 17.0 g/dL   HCT 16.1 (*) 09.6 - 04.5 %   MCV 94.8  78.0 - 100.0 fL   MCH 32.2  26.0 - 34.0 pg   MCHC 34.0  30.0 - 36.0 g/dL   RDW 40.9  81.1 - 91.4 %   Platelets 107 (*) 150 - 400 K/uL   Comment: REPEATED TO VERIFY     SPECIMEN CHECKED FOR CLOTS     PLATELET COUNT CONFIRMED BY SMEAR   No results found.  Review of Systems  Constitutional: Negative.   HENT: Negative.   Eyes: Negative.   Respiratory: Negative.   Cardiovascular: Negative.   Skin: Negative.     Blood pressure 165/90, pulse 55, temperature 97 F (36.1 C), temperature source Oral, resp. rate 18, SpO2 100.00%. Physical Exam  Constitutional: He appears well-nourished.  HENT:  Nose: Nose normal.  Mouth/Throat: Oropharynx is clear and moist.  Eyes: Pupils are equal, round, and reactive to light.  Neck: Normal range of motion. Neck supple.  Cardiovascular: Normal rate.   Respiratory: Effort normal.  GI: Soft.     Assessment/Plan Right serous OM- here for tube procedure discussed  [name] 09/14/2013, 7:34 AM

## 2013-09-14 NOTE — Op Note (Signed)
Preop/postop diagnoses: Right middle ear effusion Procedure: Right myringotomy and tube Anesthesia: Gen. Estimated blood loss less than 1 cc Indications: 78 year old who has squamous cell carcinoma the right temporal bone is had radiation therapy. He has significant decrease in his hearing and a middle ear effusion on the right side. He wants to proceed with tympanostomy tube. He was informed a risk and benefits of the procedure and options were discussed. All questions are answered and consent was obtained. Operation: Patient was taken to the operating room placed in the supine position and once the patient was asleep under general anesthesia the microscope was not operational. The operating room did not have a backup microscope. The patient given already under general anesthesia it was felt this would be inappropriate to wake the patient up because of this situation so a headlight was used to cast light through the speculum. A speculum was the smallest one that could be used because of the patient's narrow canal. He was suctioned out of some debris. The tympanic membrane could be visualized with the microscope and the light projecting through the speculum from the separate source. A myringotomy was made in the inferior quadrant serous effusion was suctioned. A Paparella tube was placed but this one when suctioning the lumen passed through the myringotomy into the middle ear. This could not be retrieved. Another Paparella tube was placed without difficulty. Ciprodex was instilled. There was no evidence of cholesteatoma or other than thickening of the tympanic membrane no other abnormalities. The patient is awakened and brought to recovery room in stable condition counts correct

## 2013-09-14 NOTE — Transfer of Care (Signed)
Immediate Anesthesia Transfer of Care Note  Patient: Cory Serrano  Procedure(s) Performed: Procedure(s): MYRINGOTOMY WITH TUBE PLACEMENT (Right)  Patient Location: PACU  Anesthesia Type:General  Level of Consciousness: awake and alert   Airway & Oxygen Therapy: Patient Spontanous Breathing  Post-op Assessment: Report given to PACU RN and Post -op Vital signs reviewed and stable  Post vital signs: Reviewed and stable  Complications: No apparent anesthesia complications

## 2013-09-17 ENCOUNTER — Encounter (HOSPITAL_COMMUNITY): Payer: Self-pay | Admitting: Otolaryngology

## 2013-10-30 ENCOUNTER — Ambulatory Visit
Admission: RE | Admit: 2013-10-30 | Discharge: 2013-10-30 | Disposition: A | Discharge status: Home / Self Care | Payer: Medicare Other | Source: Ambulatory Visit | Attending: Radiation Oncology | Admitting: Radiation Oncology

## 2013-10-30 ENCOUNTER — Encounter: Payer: Self-pay | Admitting: Radiation Oncology

## 2013-10-30 VITALS — BP 138/71 | HR 57 | Temp 98.0°F | Wt 138.6 lb

## 2013-10-30 DIAGNOSIS — C4490 Unspecified malignant neoplasm of skin, unspecified: Secondary | ICD-10-CM

## 2013-10-30 NOTE — Progress Notes (Signed)
Patient here for routine follow up completion of right ear, and right temporal bone on July 31, 2013.Appetite  improving, able to taste food better but states he can only eat small portions.Scheduled to see Dr.Byers Wednesday, 11/01/2013.Hearing loss of left ear.Taking moxifloxacin to prevent possible infection in ear.

## 2013-10-30 NOTE — Progress Notes (Signed)
  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  He seems to be making some progress at this time.  he denies any pain along his right face ear or head region. He is able to taste food better but continues to have problems with dry mouth. He tried Salagen but this was not very helpful and he subsequently stopped this medication.  he did have a tympanostomy tube placed by Dr. [name] recently.  He continues to have some mild drainage from the right ear canal but no bleeding.                              ALLERGIES:  has No Known Allergies.  Meds: Current Outpatient Prescriptions  Medication Sig Dispense Refill  . aspirin 81 MG tablet Take 81 mg by mouth daily.      [name] atenolol (TENORMIN) 50 MG tablet Take 50 mg by mouth daily.      . Coenzyme Q10 (CO Q 10) 100 MG CAPS Take 100 mg by mouth 2 (two) times daily.      . simvastatin (ZOCOR) 40 MG tablet Take 40 mg by mouth every evening.       . moxifloxacin (AVELOX) 400 MG tablet       . pilocarpine (SALAGEN) 5 MG tablet Take 1 tablet (5 mg total) by mouth 3 (three) times daily.  90 tablet  2   No current facility-administered medications for this encounter.   Facility-Administered Medications Ordered in Other Encounters  Medication Dose Route Frequency Provider Last Rate Last Dose  . sodium chloride 0.9 % injection 10 mL  10 mL Intravenous Once [name], MD        Physical Findings: The patient is in no acute distress. Patient is alert and oriented.  weight is 138 lb 9.6 oz (62.869 kg). His temperature is 98 F (36.7 C). His blood pressure  is 138/71 and his pulse is 57. His oxygen saturation is 100%. .  No palpable preauricular cervical or supraclavicular adenopathy. The lungs are clear to auscultation. The heart has a regular rhythm and rate. The oral cavity is free of secondary infection. The mucosa is somewhat moist. The right upper ear show some crusting but no active drainage. The right external ear canal is  narrowed it would not permit exam of the canal.   the right ear is  swollen compared to the left.  Lab Findings: Lab Results  Component Value Date   WBC 3.4* 09/13/2013   HGB 11.7* 09/13/2013   HCT 34.4* 09/13/2013   MCV 94.8 09/13/2013   PLT 107* 09/13/2013      Radiographic Findings: No results found.  Impression:  The patient is recovering from the effects of radiation.  No obvious recurrence on clinical exam today  Plan:  Routine followup in 2 months. The patient will likely be scheduled for repeat imaging at that time.  He will followup with Dr. [name] later this month.  _____________________________________  -----------------------------------  [name], PhD, MD

## 2014-01-01 ENCOUNTER — Ambulatory Visit: Payer: Medicare Other

## 2014-01-01 ENCOUNTER — Ambulatory Visit
Admission: RE | Admit: 2014-01-01 | Discharge: 2014-01-01 | Disposition: A | Discharge status: Home / Self Care | Payer: Medicare Other | Source: Ambulatory Visit | Attending: Radiation Oncology | Admitting: Radiation Oncology

## 2014-01-01 ENCOUNTER — Encounter: Payer: Self-pay | Admitting: Radiation Oncology

## 2014-01-01 VITALS — BP 164/83 | HR 60 | Temp 97.9°F | Wt 138.8 lb

## 2014-01-01 DIAGNOSIS — C4490 Unspecified malignant neoplasm of skin, unspecified: Secondary | ICD-10-CM

## 2014-01-01 LAB — COMPREHENSIVE METABOLIC PANEL (CC13)
ALT: 12 U/L (ref 0–55)
ANION GAP: 9 meq/L (ref 3–11)
AST: 13 U/L (ref 5–34)
Albumin: 3.1 g/dL — ABNORMAL LOW (ref 3.5–5.0)
Alkaline Phosphatase: 76 U/L (ref 40–150)
BUN: 14 mg/dL (ref 7.0–26.0)
CALCIUM: 9.1 mg/dL (ref 8.4–10.4)
CHLORIDE: 103 meq/L (ref 98–109)
CO2: 27 mEq/L (ref 22–29)
CREATININE: 0.8 mg/dL (ref 0.7–1.3)
Glucose: 117 mg/dl (ref 70–140)
Potassium: 4.4 mEq/L (ref 3.5–5.1)
Sodium: 139 mEq/L (ref 136–145)
Total Bilirubin: 0.5 mg/dL (ref 0.20–1.20)
Total Protein: 6.4 g/dL (ref 6.4–8.3)

## 2014-01-01 LAB — CBC WITH DIFFERENTIAL/PLATELET
BASO%: 0.2 % (ref 0.0–2.0)
BASOS ABS: 0 10*3/uL (ref 0.0–0.1)
EOS%: 0.7 % (ref 0.0–7.0)
Eosinophils Absolute: 0 10*3/uL (ref 0.0–0.5)
HEMATOCRIT: 31.5 % — AB (ref 38.4–49.9)
HEMOGLOBIN: 10.5 g/dL — AB (ref 13.0–17.1)
LYMPH#: 0.5 10*3/uL — AB (ref 0.9–3.3)
LYMPH%: 11.3 % — ABNORMAL LOW (ref 14.0–49.0)
MCH: 31.7 pg (ref 27.2–33.4)
MCHC: 33.3 g/dL (ref 32.0–36.0)
MCV: 95.2 fL (ref 79.3–98.0)
MONO#: 0.3 10*3/uL (ref 0.1–0.9)
MONO%: 7.8 % (ref 0.0–14.0)
NEUT#: 3.3 10*3/uL (ref 1.5–6.5)
NEUT%: 80 % — AB (ref 39.0–75.0)
PLATELETS: 140 10*3/uL (ref 140–400)
RBC: 3.31 10*6/uL — ABNORMAL LOW (ref 4.20–5.82)
RDW: 14.1 % (ref 11.0–14.6)
WBC: 4.1 10*3/uL (ref 4.0–10.3)

## 2014-01-01 NOTE — Progress Notes (Signed)
Patient for follow up completion of radiation August 2014.Started having an headache with dizziness on this past week-end.EMS called.EKG performed was normal sinus rhythmn heart rate 53.Told he was dehydrated.Took oxycoden and got good relief from aspirin.Now taking aleve.This just cam on all of a sudden.Appetite beginning to improve.Tolerates soft diet.Orthostatic vitals ok today.

## 2014-01-01 NOTE — Progress Notes (Signed)
  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  Late last week the patient developed problems with dizziness and passed out. EMS was called he was not transported to the emergency room. According to his wife the patient was "out of it" for approximately 4-5 minutes. There was no history of seizure activity or muscle weakness. Patient has had no further problems since that one episode. He has had some right parietal headaches since this incident. He denies any visual problems. He continues to have some drainage of his right external ear canal but no bleeding.                           ALLERGIES:  has No Known Allergies.  Meds: Current Outpatient Prescriptions  Medication Sig Dispense Refill  . aspirin 81 MG tablet Take 81 mg by mouth daily.      [name] atenolol (TENORMIN) 50 MG tablet Take 50 mg by mouth daily.      . simvastatin (ZOCOR) 40 MG tablet Take 40 mg by mouth every evening.       . Coenzyme Q10 (CO Q 10) 100 MG CAPS Take 100 mg by mouth 2 (two) times daily.      . pilocarpine (SALAGEN) 5 MG tablet Take 1 tablet (5 mg total) by mouth 3 (three) times daily.  90 tablet  2   No current facility-administered medications for this encounter.   Facility-Administered Medications Ordered in Other Encounters  Medication Dose Route Frequency Provider Last Rate Last Dose  . sodium chloride 0.9 % injection 10 mL  10 mL Intravenous Once Blair Promise, MD        Physical Findings: The patient is in no acute distress. Patient is alert and oriented.  weight is 138 lb 12.8 oz (62.959 kg). His  temperature is 97.9 F (36.6 C). His blood pressure is 164/83 and his pulse is 60. His oxygen saturation is 100%. . The lungs are clear to auscultation. The heart has a regular rhythm and rate. There is no palpable cervical supraclavicular or axillary adenopathy. The oral cavity reveals trismus. No obvious mucosal lesions.  Poor dentition.  Examination of the right external year reveals eschar present along the external canal and upper ear.  The external ear canal is narrowed. I was unable to visualize any tympanostomy tube  Due to be significant narrowing.  Lab Findings: Lab Results  Component Value Date   WBC 4.1 01/01/2014   HGB 10.5* 01/01/2014   HCT 31.5* 01/01/2014   MCV 95.2 01/01/2014   PLT 140 01/01/2014      Radiographic Findings: No results found.  Impression:  The patient is recovering from the effects of radiation.    Plan:  He will see Dr. Janace Hoard on Tuesday of this week. After his evaluation patient will be set up for  repeat imaging. I may consider MRI given the patient's recent events late last week.  ____________________________________ Blair Promise, MD

## 2014-01-03 ENCOUNTER — Telehealth: Payer: Self-pay | Admitting: Oncology

## 2014-01-03 NOTE — Telephone Encounter (Signed)
Cory Serrano, Dr. Peggyann Juba nurse called and said that Cory Serrano had missed his appointment with Dr. Janace Hoard because he was not feeling well.  His wife mentioned that Dr. Sondra Come was thinking about scheduling an MRI.  She said that Dr. Janace Hoard is OK with the MRI and to call him with any questions at [phone].

## 2014-01-09 ENCOUNTER — Telehealth: Payer: Self-pay | Admitting: *Deleted

## 2014-01-09 NOTE — Telephone Encounter (Signed)
CALLED MS. Burhanuddin Cullin AND TOLD HER THAT DR. KINARD WANTS HER HUSBAND TO SEE DR. BYERS BEFORE HE ORDERS THE SCAN, PATIENT'S WIFE IS AWARE OF THIS.

## 2014-01-18 ENCOUNTER — Other Ambulatory Visit (HOSPITAL_COMMUNITY): Payer: Self-pay | Admitting: Otolaryngology

## 2014-01-18 DIAGNOSIS — IMO0002 Reserved for concepts with insufficient information to code with codable children: Secondary | ICD-10-CM

## 2014-01-31 ENCOUNTER — Encounter (HOSPITAL_COMMUNITY): Payer: Self-pay

## 2014-01-31 ENCOUNTER — Ambulatory Visit (HOSPITAL_COMMUNITY)
Admission: RE | Admit: 2014-01-31 | Discharge: 2014-01-31 | Disposition: A | Discharge status: Home / Self Care | Payer: Medicare Other | Source: Ambulatory Visit | Attending: Otolaryngology | Admitting: Otolaryngology

## 2014-01-31 ENCOUNTER — Encounter (HOSPITAL_COMMUNITY): Payer: Medicare Other

## 2014-01-31 ENCOUNTER — Encounter (HOSPITAL_COMMUNITY)
Admission: RE | Admit: 2014-01-31 | Discharge: 2014-01-31 | Disposition: A | Discharge status: Home / Self Care | Payer: Medicare Other | Source: Ambulatory Visit | Attending: Otolaryngology | Admitting: Otolaryngology

## 2014-01-31 DIAGNOSIS — C760 Malignant neoplasm of head, face and neck: Secondary | ICD-10-CM | POA: Insufficient documentation

## 2014-01-31 DIAGNOSIS — R221 Localized swelling, mass and lump, neck: Secondary | ICD-10-CM

## 2014-01-31 DIAGNOSIS — IMO0002 Reserved for concepts with insufficient information to code with codable children: Secondary | ICD-10-CM

## 2014-01-31 DIAGNOSIS — C44221 Squamous cell carcinoma of skin of unspecified ear and external auricular canal: Secondary | ICD-10-CM | POA: Insufficient documentation

## 2014-01-31 DIAGNOSIS — R220 Localized swelling, mass and lump, head: Secondary | ICD-10-CM | POA: Insufficient documentation

## 2014-01-31 DIAGNOSIS — M948X9 Other specified disorders of cartilage, unspecified sites: Secondary | ICD-10-CM | POA: Insufficient documentation

## 2014-01-31 DIAGNOSIS — R911 Solitary pulmonary nodule: Secondary | ICD-10-CM | POA: Insufficient documentation

## 2014-01-31 DIAGNOSIS — R234 Changes in skin texture: Secondary | ICD-10-CM | POA: Insufficient documentation

## 2014-01-31 LAB — GLUCOSE, CAPILLARY: GLUCOSE-CAPILLARY: 94 mg/dL (ref 70–99)

## 2014-01-31 LAB — POCT I-STAT CREATININE: Creatinine, Ser: 1 mg/dL (ref 0.50–1.35)

## 2014-01-31 MED ORDER — IOHEXOL 300 MG/ML  SOLN
100.0000 mL | Freq: Once | INTRAMUSCULAR | Status: AC | PRN
  Administered 2014-01-31: 100 mL via INTRAVENOUS

## 2014-01-31 MED ORDER — FLUDEOXYGLUCOSE F - 18 (FDG) INJECTION: 17.6000 | Freq: Once | INTRAVENOUS | Status: AC | PRN

## 2014-01-31 MED ORDER — FLUDEOXYGLUCOSE F - 18 (FDG) INJECTION
17.6000 | Freq: Once | INTRAVENOUS | Status: AC | PRN
  Administered 2014-01-31: 17.6 via INTRAVENOUS

## 2014-02-07 NOTE — Progress Notes (Signed)
Histology and Location of Primary Cancer: metastatic squamous cell carcinoma  Sites of Visceral and Bony Metastatic Disease: skin of the right preauricular area, right ear, right mandible bone  Location(s) of Symptomatic Metastases: right mandible - per CT from 01/31/14 -Marked osseous erosion of the right temporal bone, predominantly involving the mastoid portion as above. There is erosion of the tegmen mastoideum and of the superior semicircular canal. Extensive erosion involves the sigmoid plate, jugular foramen and posterior carotid canal as well as the mastoid tip.   Past/Anticipated chemotherapy by medical oncology, if any: no  Pain on a scale of 0-10 is: 2 in right ear  Ambulatory status? Walker? Wheelchair?: ambulatory  SAFETY ISSUES:  Prior radiation? Yes - 10/27/12 -12/15/12 left neck, 6/23//14- 8/4/14R periauricular area, ear, temporal bone, 66 gray 30 fx    Pacemaker/ICD? no  Possible current pregnancy? no  Is the patient on methotrexate? no  Current Complaints / other details:  Has scabs and green/yellow drainage from right ear.  Using Ciprodex gtts twice a day and aleve for pain.   Reports he can eat small amounts and drinks 1-2 ensures per day.  He feels like his esophagus has narrowed.  Has lost 6 lbs since 01/01/14.  Reports a dry mouth and his voice is raspy.

## 2014-02-08 ENCOUNTER — Ambulatory Visit
Admission: RE | Admit: 2014-02-08 | Discharge: 2014-02-08 | Disposition: A | Discharge status: Home / Self Care | Payer: Medicare Other | Source: Ambulatory Visit | Attending: Radiation Oncology | Admitting: Radiation Oncology

## 2014-02-08 ENCOUNTER — Encounter: Payer: Self-pay | Admitting: Radiation Oncology

## 2014-02-08 VITALS — BP 144/77 | HR 55 | Temp 97.8°F | Ht 70.0 in | Wt 132.0 lb

## 2014-02-08 DIAGNOSIS — C7952 Secondary malignant neoplasm of bone marrow: Secondary | ICD-10-CM

## 2014-02-08 DIAGNOSIS — Z7982 Long term (current) use of aspirin: Secondary | ICD-10-CM | POA: Insufficient documentation

## 2014-02-08 DIAGNOSIS — C801 Malignant (primary) neoplasm, unspecified: Secondary | ICD-10-CM | POA: Insufficient documentation

## 2014-02-08 DIAGNOSIS — C4490 Unspecified malignant neoplasm of skin, unspecified: Secondary | ICD-10-CM

## 2014-02-08 DIAGNOSIS — R221 Localized swelling, mass and lump, neck: Secondary | ICD-10-CM

## 2014-02-08 DIAGNOSIS — C792 Secondary malignant neoplasm of skin: Secondary | ICD-10-CM | POA: Insufficient documentation

## 2014-02-08 DIAGNOSIS — Z79899 Other long term (current) drug therapy: Secondary | ICD-10-CM | POA: Insufficient documentation

## 2014-02-08 DIAGNOSIS — C7951 Secondary malignant neoplasm of bone: Secondary | ICD-10-CM | POA: Insufficient documentation

## 2014-02-08 NOTE — Progress Notes (Signed)
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 Melissa Montane.   the patient was recently seen by myself and there was concern for recurrence. Patient was seen by Dr. Janace Hoard and a PET scan as well as CT of the temporal bones confirmed recurrence/persistence. Patient is also had more problems with swelling in the right external ear canal and drainage. He has intermittent pain in his right ear and face.                           ALLERGIES:  has No Known Allergies.  Meds: Current Outpatient Prescriptions  Medication Sig Dispense Refill  . aspirin 81 MG tablet Take 81 mg by mouth daily.      [name] atenolol (TENORMIN) 50 MG tablet Take 50 mg by mouth daily.      [name] CIPRODEX otic suspension Place into the right ear 2 (two) times daily.       . Coenzyme Q10 (CO Q 10) 100 MG CAPS Take 100 mg by mouth 2 (two) times daily.      [name] HYDROcodone-acetaminophen (HYCET) 7.5-325 mg/15 ml solution Take 10 mLs by mouth 4 (four) times daily as needed for moderate pain.      . naproxen sodium (ANAPROX) 220 MG tablet Take 220 mg by mouth 2 (two) times daily with a meal.      . simvastatin (ZOCOR) 40 MG tablet Take 40 mg by mouth every evening.       . pilocarpine (SALAGEN) 5 MG tablet Take 1 tablet (5 mg total) by mouth 3 (three) times daily.  90 tablet  2   No current facility-administered medications for this encounter.   Facility-Administered Medications Ordered in Other Encounters  Medication Dose Route Frequency Provider Last Rate Last Dose  .  sodium chloride 0.9 % injection 10 mL  10 mL Intravenous Once Blair Promise, MD        Physical Findings: The patient is in no acute distress. Patient is alert and oriented.  height is 5\' 10"  (1.778 m) and weight is 132 lb (59.875 kg). His temperature is 97.8 F (36.6 C). His blood pressure is 144/77 and his pulse is 55. His oxygen saturation is 100%. .  The patient has noticed numbness along his right lower face. On exam he has right facial weakness and has some difficulty in closing his right eye. He also has difficulty with smiling along the right side. The extraocular eye movements are intact. There is no palpable adenopathy in the left or right neck or supraclavicular region. The lungs are clear to auscultation. The heart has regular rhythm and rate.  Patient has more eschar/possible tumor growth along the right external ear. He also appears to have tumor along the external ear canal on the right side.   Lab Findings: Lab Results  Component Value Date   WBC 4.1 01/01/2014   HGB 10.5* 01/01/2014   HCT 31.5* 01/01/2014   MCV 95.2 01/01/2014   PLT 140 01/01/2014  Radiographic Findings: Nm Pet Image Restag (ps) Skull Base To Thigh  01/31/2014   CLINICAL DATA:  Subsequent treatment strategy for head and neck carcinoma.  EXAM: NUCLEAR MEDICINE PET SKULL BASE TO THIGH  FASTING BLOOD GLUCOSE:  Value: 94mg /dl  TECHNIQUE: 17.6 mCi F-18 FDG was injected intravenously. CT data was obtained and used for attenuation correction and anatomic localization only. (This was not acquired as a diagnostic CT examination.) Additional exam technical data entered on technologist worksheet.  COMPARISON:  None.  FINDINGS: NECK  Along the right external auditory canal there is persistent intense hypermetabolic activity along the anterior and posterior wall of the canal with SUV max 16.3. This is decreased significantly from SUV max 26 on comparison exam. There is decreased activity associated with the skullbase (peak stress  apex and mastoid air cells) which is more difficult to quantify due to close proximity of the brain tissue.  There increased hypermetabolic activity of a lymph node inferior to the mastoid air cells in the left right level IIB position (image 163). This has intense metabolic activity with SUV max 5.9. This lymph node measures approximately 10 mm (image 29 and appears increase in size and metabolic activity compared to prior.  CHEST  No hypermetabolic hilar or axillary lymph nodes. There is small pleural-based nodule at the left lung base measuring 7 mm (image 109) which is not hypermetabolic but is not seen on prior exam.  ABDOMEN/PELVIS  No abnormal hypermetabolic activity within the liver, pancreas, adrenal glands, or spleen. No hypermetabolic lymph nodes in the abdomen or pelvis.  SKELETON  No focal hypermetabolic activity to suggest skeletal metastasis.  IMPRESSION: 1. Persistent but decreased metabolic activity associated with the lesion along the external auditory canal. 2. Decrease metabolic activity at the right skull base . 3. Increased metabolic activity of a small right level IIB lymph node. 4. No evidence distant metastatic disease. 5. Small pulmonary nodule at the left lung base is not seen on prior. Favor a benign etiology but recommend attention on follow-up.   Electronically Signed   By: Suzy Bouchard M.D.   On: 01/31/2014 11:33   Ct Temporal Bones W/cm  01/31/2014   CLINICAL DATA:  Squamous cell carcinoma of the right ear. Decreased right-sided hearing. Left neck mass.  EXAM: CT TEMPORAL BONES WITH CONTRAST  TECHNIQUE: Axial and coronal plane CT imaging of the petrous temporal bones was performed with thin-collimation image reconstruction after intravenous contrast administration. Multiplanar CT image reconstructions were also generated.  CONTRAST:  181mL OMNIPAQUE IOHEXOL 300 MG/ML  SOLN  COMPARISON:  PET-CT 01/31/2014 and earlier.  Brain MRI 04/07/2013.  FINDINGS: RIGHT: There is diffuse soft  tissue thickening involving the external auditory canal. Soft tissue thickening is also present anterior and superior to the superficial portion of the EAC. There is osseous erosion involving the anterior wall of the external auditory canal. The tympanic membrane is thickened, and there is a tympanostomy tube in place. There is a small to moderate amount of fluid/ soft tissue within the tympanic cavity, predominantly in the epitympanum and mesotympanum. The malleus and incus appear intact. The stapes is not well visualized due to surrounding soft tissue. There is dehiscence of the medial aspect of the tegmen mastoideum and of the anterior aspect of the superior semicircular canal. The internal auditory canal, cochlea, and vestibule are unremarkable.  There is partial opacification of multiple mastoid air cells superiorly. Inferiorly, there is near complete mastoid air cell opacification. There is extensive osseous erosion involving the mastoid  tip with complete osseous destruction medially. There is marked osseous erosion of the medial mastoid portion of the temporal bone/sigmoid plate and jugular foramen with multiple loculated gas present in this region. There is also osseous erosion of the posterior aspect of the petrous carotid canal. The visualized internal carotid arteries and internal jugular veins appear patent.  LEFT: A small amount of cerumen or other soft tissue/debris is present in the external auditory canal. The tympanic membrane, tympanic cavity, internal auditory canal, cochlea, vestibule, and semicircular canals are unremarkable. Ossicles appear intact. The mastoid air cells are clear.  There is prominent sclerosis of the left sphenoid bone about the temporomandibular joint with enlargement and irregularity of the mandibular condyle and narrowing of the temporomandibular joint space suggestive of advanced osteoarthrosis. The visualized portions of the brain and orbits are unremarkable. Minimal  mucosal thickening is noted within the left ethmoid air cells and of the left maxillary sinus.  IMPRESSION: 1. Diffuse soft tissue thickening involving the right external ear structures and external auditory canal with osseous destruction of the anterior wall of the EAC. This may reflect patient's known cancer. 2. Marked osseous erosion of the right temporal bone, predominantly involving the mastoid portion as above. There is erosion of the tegmen mastoideum and of the superior semicircular canal. Extensive erosion involves the sigmoid plate, jugular foramen and posterior carotid canal as well as the mastoid tip.   Electronically Signed   By: Logan Bores   On: 01/31/2014 13:56    Impression:  Persistent disease as documented above. I carefully reviewed the areas of persistence on most recent PET scan and CT scan as it relates to the patient's prior treatments.  Unfortunately the patient is not a candidate for additional radiation therapy directed at this region.  At this time the patient does not wish to consider surgery and consultation at the outside tertiary care center.  I would recommend he be evaluated by medical oncology to see if he would be a candidate for chemotherapy.  Plan:  Referral to medical oncology with Dr. Alvy Bimler.  ____________________________________ Blair Promise, MD

## 2014-02-08 NOTE — Progress Notes (Signed)
Please see the Nurse Progress Note in the MD Initial Consult Encounter for this patient. 

## 2014-02-09 ENCOUNTER — Telehealth: Payer: Self-pay | Admitting: Hematology and Oncology

## 2014-02-09 NOTE — Telephone Encounter (Signed)
NEW PATIENT SCHEDULED FOR 02/17 @ 1:30 W/DR. Buras.  DX- SKIN CA

## 2014-02-12 ENCOUNTER — Telehealth: Payer: Self-pay | Admitting: Hematology and Oncology

## 2014-02-12 NOTE — Telephone Encounter (Signed)
C/D 02/12/14 for appt. 02/13/14

## 2014-02-13 ENCOUNTER — Telehealth: Payer: Self-pay | Admitting: Hematology and Oncology

## 2014-02-13 ENCOUNTER — Ambulatory Visit: Payer: Medicare Other

## 2014-02-13 ENCOUNTER — Telehealth: Payer: Self-pay | Admitting: *Deleted

## 2014-02-13 ENCOUNTER — Encounter: Payer: Self-pay | Admitting: Hematology and Oncology

## 2014-02-13 ENCOUNTER — Encounter: Payer: Self-pay | Admitting: *Deleted

## 2014-02-13 ENCOUNTER — Ambulatory Visit (HOSPITAL_BASED_OUTPATIENT_CLINIC_OR_DEPARTMENT_OTHER): Payer: Medicare Other | Admitting: Hematology and Oncology

## 2014-02-13 VITALS — BP 141/70 | HR 63 | Temp 97.8°F | Resp 18 | Ht 70.0 in | Wt 134.4 lb

## 2014-02-13 DIAGNOSIS — R1310 Dysphagia, unspecified: Secondary | ICD-10-CM

## 2014-02-13 DIAGNOSIS — R52 Pain, unspecified: Secondary | ICD-10-CM

## 2014-02-13 DIAGNOSIS — C4490 Unspecified malignant neoplasm of skin, unspecified: Secondary | ICD-10-CM

## 2014-02-13 DIAGNOSIS — G529 Cranial nerve disorder, unspecified: Secondary | ICD-10-CM

## 2014-02-13 DIAGNOSIS — C779 Secondary and unspecified malignant neoplasm of lymph node, unspecified: Secondary | ICD-10-CM

## 2014-02-13 DIAGNOSIS — R634 Abnormal weight loss: Secondary | ICD-10-CM

## 2014-02-13 MED ORDER — LIDOCAINE-PRILOCAINE 2.5-2.5 % EX CREA: 1.0000 "application " | TOPICAL_CREAM | CUTANEOUS | Status: AC | PRN

## 2014-02-13 MED ORDER — PROCHLORPERAZINE MALEATE 10 MG PO TABS: 10.0000 mg | ORAL_TABLET | Freq: Four times a day (QID) | ORAL | Status: DC | PRN

## 2014-02-13 MED ORDER — ONDANSETRON HCL 8 MG PO TABS: 8.0000 mg | ORAL_TABLET | Freq: Three times a day (TID) | ORAL | Status: DC | PRN

## 2014-02-13 NOTE — Progress Notes (Signed)
Met with patient during scheduled new patient appt with Dr. Alvy Bimler.  Introduced myself as his Navigator, explained my role as a member of the Care Team, provided contact information, encouraged him to contact me with questions/concerns as treatments/procedures begin.  He indicated understanding.    In support of Dr. Calton Dach mention, showed him picture of PAC, explained how it works, showed him infusion area and [facility] so he is familiar for upcoming appts.  Initiating navigation as L1 patient (new) with this encounter.  Gayleen Orem, RN, BSN, [facility] Head [facility] Neck Oncology Navigator [phone]

## 2014-02-13 NOTE — Telephone Encounter (Signed)
Per staff message and POF I have scheduled appts.  JMW  

## 2014-02-13 NOTE — Progress Notes (Signed)
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 I reviewed his chart extensively and summarized his oncologic history as follows He had background history of recurrent skin cancer (squamous cell and basal cell carcinoma) requiring multiple Moh's procedures. In 2013, he presented with left neck mass and was recommended surgery. 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, [id number]     Skin cancer   08/25/2012 Imaging PET Ct showed LN on the left side of neck   10/27/2012 - 12/15/2012 Radiation Therapy Patient underwent palliative radiation to the left neck for LN involvement, presumed metastatic squamous cell carcinoma from invasive skin cancer   05/10/2013 Procedure Biopsy of the mass near the right parotid confirmed squamous cell carcinoma   06/19/2013 - 07/31/2013 Radiation Therapy He underwent radiation to the right ear and mastoid region for recurrent cancer   09/14/2013 Surgery He underwent myringotomy and placement of tube to right inner ear   01/31/2014 Imaging Ct imaging showed extensive erosion of the cancer involving the right ear and mastoid bone. PET CTs scan also confirmed LN involvement on the right cervical LN   After his last radiation treatment, the cancer on the right ear relapsed quickly. He has told he has no further surgical or radiation options left and was hence referred here for consideration for systemic chemotherapy He has profound right sided hearing  deficit, difficulties with chewing food and swallowing difficulties. He denies painful swallowing, changes in the quality of his voice or other lymphadenopathy He has lost 40-45 pounds of weight over the past 1 year. Recently, the right side of his face became droopy and he could no longer close his right eye He complained on blurry vision on the right eye and occasional pain MEDICAL HISTORY:  Past Medical History  Diagnosis Date  . Hyperlipidemia   . BPH (benign prostatic hyperplasia)   . Cardiomyopathy   . Obstructive uropathy   . Bradycardia   . 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 Arthritis     hands per patient  . Cataract     beginning of  . Hx of radiation therapy 10/27/12 -12/15/12    left neck  . Hx of radiation therapy 6/23//14- 07/31/13    R periauricular area, ear, temporal bone, 66 gray 30 fx  . Hypertension   . H/O prostate biopsy     was neg    SURGICAL HISTORY: Past Surgical History  Procedure Laterality Date  . Tonsillectomy    . Hernia repair  8-9 years ago    right inguinal  . Wisdom tooth extraction    . Cardiovascular stress test  2013    copy in [facility]  . Colonoscopy    . Prostate biopsy       neg. 7-8 years ago  . Myringotomy with tube placement Right 09/14/2013    Procedure: MYRINGOTOMY WITH TUBE PLACEMENT;  Surgeon: Melissa Montane, MD;  Location: [facility] OR;  Service: ENT;  Laterality: Right;    SOCIAL HISTORY: History   Social History  . Marital Status: Married    Spouse Name: N/A    Number of Children: 1  . Years of Education: N/A   Occupational History  .    .      investment financial   Social History Main Topics  . Smoking status: Former Smoker -- 1.50 packs/day for 10 years    Types: Cigarettes    Quit date: 12/28/1966  . Smokeless tobacco: Never Used  . Alcohol Use: 9.0 oz/week    1 Shots of liquor, 14 Cans of beer per week     Comment: 0.6 ounces week or  less 2 beers a day most days  . Drug Use: No  . Sexual Activity: Not on file   Other Topics Concern  . Not on file   Social History Narrative  . No narrative on file    FAMILY HISTORY: Family History  Problem Relation Age of Onset  . Cancer Mother     colon    ALLERGIES:  has No Known Allergies.  MEDICATIONS:  Current Outpatient Prescriptions  Medication Sig Dispense Refill  . aspirin 81 MG tablet Take 81 mg by mouth daily.      [name] atenolol (TENORMIN) 50 MG tablet Take 50 mg by mouth daily.      [name] CIPRODEX otic suspension Place into the right ear 2 (two) times daily.       . Coenzyme Q10 (CO Q 10) 100 MG CAPS Take 100 mg by mouth 2 (two) times daily.      . naproxen sodium (ANAPROX) 220 MG tablet Take 220 mg by mouth 2 (two) times daily with a meal.      . simvastatin (ZOCOR) 40 MG tablet Take 40 mg by mouth every evening.       [name] HYDROcodone-acetaminophen (HYCET) 7.5-325 mg/15 ml solution Take 10 mLs by mouth 4 (four) times daily as needed for moderate pain.      [name] lidocaine-prilocaine (EMLA) cream Apply 1 application topically as needed.  30 g  0  . ondansetron (ZOFRAN) 8 MG tablet Take 1 tablet (8 mg total) by mouth every 8 (eight) hours as needed for nausea.  30 tablet  1  . prochlorperazine (COMPAZINE) 10 MG tablet Take 1 tablet (10 mg total) by mouth every 6 (six) hours as needed (Nausea or vomiting).  30 tablet  1   No current facility-administered medications for this visit.   Facility-Administered Medications Ordered in Other Visits  Medication Dose Route Frequency Provider Last Rate Last Dose  . sodium chloride 0.9 % injection 10 mL  10 mL Intravenous Once Blair Promise, MD        REVIEW OF SYSTEMS:   Constitutional: Denies fevers, chills Ears, nose, mouth, throat, and face: Denies mucositis or sore throat Respiratory: Denies cough, dyspnea or wheezes Cardiovascular: Denies palpitation, chest discomfort or lower extremity swelling Gastrointestinal:  Denies  nausea, heartburn or change in bowel habits Lymphatics: Denies new lymphadenopathy or easy bruising Neurological:Denies numbness, tingling or new weaknesses Behavioral/Psych: Mood is stable, no new changes  All other systems were reviewed with the patient and are negative.  PHYSICAL EXAMINATION: ECOG PERFORMANCE STATUS: 1 - Symptomatic but completely ambulatory  Filed Vitals:   02/13/14 1339  BP: 141/70  Pulse: 63  Temp: 97.8 F (36.6 C)  Resp: 18   Filed Weights   02/13/14 1339  Weight: 134 lb 6.4  oz (60.963 kg)    GENERAL:alert, no distress and comfortable. He looks thin and cachectic SKIN: multiple skin lesions resembling keratosis. Noted destructive lesion involving the right external ear EYES: normal, conjunctiva are pink and non-injected, sclera clear. Noted inability to close the right eye OROPHARYNX:very poor dentitian, no exudate, no erythema and lips, buccal mucosa, and tongue normal  NECK: supple, thyroid normal size, non-tender, without nodularity LYMPH:  palpable lymphadenopathy in the posterior auricle area but none in the cervical, axillary or inguinal LUNGS: clear to auscultation and percussion with normal breathing effort HEART: regular rate & rhythm and no murmurs and no lower extremity edema ABDOMEN:abdomen soft, non-tender and normal bowel sounds Musculoskeletal:no cyanosis of digits and no clubbing  PSYCH: alert & oriented x 3 with mild dysphasia  NEURO: Obvious unilateral right facial nerve palsy  LABORATORY DATA:  I have reviewed the data as listed Lab Results  Component Value Date   WBC 4.1 01/01/2014   HGB 10.5* 01/01/2014   HCT 31.5* 01/01/2014   MCV 95.2 01/01/2014   PLT 140 01/01/2014   Lab Results  Component Value Date   NA 139 01/01/2014   K 4.4 01/01/2014   CL 103 09/13/2013   CO2 27 01/01/2014    RADIOGRAPHIC STUDIES: I have personally reviewed the radiological images as listed and agreed with the findings in the report. Nm Pet Image Restag (ps)  Skull Base To Thigh  01/31/2014   CLINICAL DATA:  Subsequent treatment strategy for head and neck carcinoma.  EXAM: NUCLEAR MEDICINE PET SKULL BASE TO THIGH  FASTING BLOOD GLUCOSE:  Value: 94mg /dl  TECHNIQUE: 17.6 mCi F-18 FDG was injected intravenously. CT data was obtained and used for attenuation correction and anatomic localization only. (This was not acquired as a diagnostic CT examination.) Additional exam technical data entered on technologist worksheet.  COMPARISON:  None.  FINDINGS: NECK  Along the right external auditory canal there is persistent intense hypermetabolic activity along the anterior and posterior wall of the canal with SUV max 16.3. This is decreased significantly from SUV max 26 on comparison exam. There is decreased activity associated with the skullbase (peak stress apex and mastoid air cells) which is more difficult to quantify due to close proximity of the brain tissue.  There increased hypermetabolic activity of a lymph node inferior to the mastoid air cells in the left right level IIB position (image 163). This has intense metabolic activity with SUV max 5.9. This lymph node measures approximately 10 mm (image 29 and appears increase in size and metabolic activity compared to prior.  CHEST  No hypermetabolic hilar or axillary lymph nodes. There is small pleural-based nodule at the left lung base measuring 7 mm (image 109) which is not hypermetabolic but is not seen on prior exam.  ABDOMEN/PELVIS  No abnormal hypermetabolic activity within the liver, pancreas, adrenal glands, or spleen. No hypermetabolic lymph nodes in the abdomen or pelvis.  SKELETON  No focal hypermetabolic activity to suggest skeletal metastasis.  IMPRESSION: 1. Persistent but decreased metabolic activity associated with the lesion along the external auditory canal. 2. Decrease metabolic activity at the right skull base . 3. Increased metabolic activity of a small right level IIB lymph node. 4. No evidence distant  metastatic disease. 5. Small pulmonary nodule at the left lung base is not seen on prior. Favor a benign etiology but recommend attention on follow-up.   Electronically Signed   By: Suzy Bouchard M.D.   On: 01/31/2014 11:33   Ct Temporal Bones  W/cm  01/31/2014   CLINICAL DATA:  Squamous cell carcinoma of the right ear. Decreased right-sided hearing. Left neck mass.  EXAM: CT TEMPORAL BONES WITH CONTRAST  TECHNIQUE: Axial and coronal plane CT imaging of the petrous temporal bones was performed with thin-collimation image reconstruction after intravenous contrast administration. Multiplanar CT image reconstructions were also generated.  CONTRAST:  151mL OMNIPAQUE IOHEXOL 300 MG/ML  SOLN  COMPARISON:  PET-CT 01/31/2014 and earlier.  Brain MRI 04/07/2013.  FINDINGS: RIGHT: There is diffuse soft tissue thickening involving the external auditory canal. Soft tissue thickening is also present anterior and superior to the superficial portion of the EAC. There is osseous erosion involving the anterior wall of the external auditory canal. The tympanic membrane is thickened, and there is a tympanostomy tube in place. There is a small to moderate amount of fluid/ soft tissue within the tympanic cavity, predominantly in the epitympanum and mesotympanum. The malleus and incus appear intact. The stapes is not well visualized due to surrounding soft tissue. There is dehiscence of the medial aspect of the tegmen mastoideum and of the anterior aspect of the superior semicircular canal. The internal auditory canal, cochlea, and vestibule are unremarkable.  There is partial opacification of multiple mastoid air cells superiorly. Inferiorly, there is near complete mastoid air cell opacification. There is extensive osseous erosion involving the mastoid tip with complete osseous destruction medially. There is marked osseous erosion of the medial mastoid portion of the temporal bone/sigmoid plate and jugular foramen with multiple  loculated gas present in this region. There is also osseous erosion of the posterior aspect of the petrous carotid canal. The visualized internal carotid arteries and internal jugular veins appear patent.  LEFT: A small amount of cerumen or other soft tissue/debris is present in the external auditory canal. The tympanic membrane, tympanic cavity, internal auditory canal, cochlea, vestibule, and semicircular canals are unremarkable. Ossicles appear intact. The mastoid air cells are clear.  There is prominent sclerosis of the left sphenoid bone about the temporomandibular joint with enlargement and irregularity of the mandibular condyle and narrowing of the temporomandibular joint space suggestive of advanced osteoarthrosis. The visualized portions of the brain and orbits are unremarkable. Minimal mucosal thickening is noted within the left ethmoid air cells and of the left maxillary sinus.  IMPRESSION: 1. Diffuse soft tissue thickening involving the right external ear structures and external auditory canal with osseous destruction of the anterior wall of the EAC. This may reflect patient's known cancer. 2. Marked osseous erosion of the right temporal bone, predominantly involving the mastoid portion as above. There is erosion of the tegmen mastoideum and of the superior semicircular canal. Extensive erosion involves the sigmoid plate, jugular foramen and posterior carotid canal as well as the mastoid tip.   Electronically Signed   By: Logan Bores   On: 01/31/2014 13:56    ASSESSMENT&PLAN:  #1 Invasive squamous cell carcinoma of the skin with destruction of the right external ear, erosion into the nearby bony structures and 7 th cranial nerve palsy We discussed the role of chemotherapy. The intent is for palliative.  We discussed some of the risks, benefits, side-effects of carboplatin. Some of the short term side-effects included, though not limited to, including weight loss, life threatening infections,  risk of allergic reactions, need for transfusions of blood products, nausea, vomiting, change in bowel habits, loss of hair, admission to hospital for various reasons, and risks of death.   Long term side-effects are also discussed including risks of infertility, permanent  damage to nerve function, hearing loss, chronic fatigue, kidney damage with possibility needing hemodialysis, and rare secondary malignancy including bone marrow disorders.  The patient is aware that the response rates discussed earlier is not guaranteed.  After a long discussion, patient made an informed decision to proceed with the prescribed plan of care and went ahead to sign the consent form today.   Patient education material was dispensed. I plan to start him on weekly chemotherapy ASAP next week #2 Venous access I discussed with him risk and benefit of port placement and he agreed to proceed #3 Weight loss and dysphagia I recommend referral to speech and swallow assessment and nutritional consult #4 right cranial nerve palsy In order to protect his right eye, I recommend regular over the counter topical eye drops for lubrication and to wear and patch over the right eye to close it #5 pain He will continue over the counter analgesia  Orders Placed This Encounter  Procedures  . IR Fluoro Guide CV Line Right    Port for chemo    Standing Status: Future     Number of Occurrences:      Standing Expiration Date: 04/14/2015    Order Specific Question:  Reason for exam:    Answer:  placement of port for chemo    Order Specific Question:  Preferred Imaging Location?    Answer:  [facility]  . CBC with Differential    Standing Status: Standing     Number of Occurrences: 9     Standing Expiration Date: 02/13/2015  . Comprehensive metabolic panel    Standing Status: Standing     Number of Occurrences: 9     Standing Expiration Date: 02/13/2015  . Magnesium    Standing Status: Standing     Number of  Occurrences: 9     Standing Expiration Date: 02/13/2015  . Ambulatory referral to Speech Therapy    Referral Priority:  Routine    Referral Type:  Speech Therapy    Referral Reason:  Specialty Services Required    Requested Specialty:  Speech Pathology    Number of Visits Requested:  1    All questions were answered. The patient knows to call the clinic with any problems, questions or concerns. I spent 40 minutes counseling the patient face to face. The total time spent in the appointment was 60 minutes and more than 50% was on counseling.     [facility], Thalia Turkington, MD 02/13/2014 5:37 PM

## 2014-02-13 NOTE — Telephone Encounter (Signed)
gave pt appt for MD and chemo class, called Speech Rehab , ledft meassage , emailed Sharyn Lull regarding chemo

## 2014-02-14 ENCOUNTER — Telehealth: Payer: Self-pay | Admitting: Hematology and Oncology

## 2014-02-14 ENCOUNTER — Encounter (HOSPITAL_COMMUNITY): Payer: Self-pay | Admitting: Pharmacy Technician

## 2014-02-14 NOTE — Telephone Encounter (Signed)
Talked to pt's wife they will pick up appt calendar for March tomorrow

## 2014-02-15 ENCOUNTER — Other Ambulatory Visit: Payer: Self-pay | Admitting: Radiology

## 2014-02-15 ENCOUNTER — Other Ambulatory Visit: Payer: Self-pay | Admitting: *Deleted

## 2014-02-15 ENCOUNTER — Other Ambulatory Visit: Payer: Medicare Other

## 2014-02-15 ENCOUNTER — Encounter (INDEPENDENT_AMBULATORY_CARE_PROVIDER_SITE_OTHER): Payer: Self-pay

## 2014-02-16 ENCOUNTER — Encounter: Payer: Self-pay | Admitting: Hematology and Oncology

## 2014-02-16 ENCOUNTER — Other Ambulatory Visit: Payer: Self-pay | Admitting: Hematology and Oncology

## 2014-02-16 ENCOUNTER — Ambulatory Visit: Payer: Medicare Other | Attending: Hematology and Oncology

## 2014-02-16 ENCOUNTER — Other Ambulatory Visit: Payer: Self-pay | Admitting: Radiology

## 2014-02-16 DIAGNOSIS — R1310 Dysphagia, unspecified: Secondary | ICD-10-CM | POA: Insufficient documentation

## 2014-02-16 DIAGNOSIS — R4701 Aphasia: Secondary | ICD-10-CM | POA: Insufficient documentation

## 2014-02-16 DIAGNOSIS — IMO0001 Reserved for inherently not codable concepts without codable children: Secondary | ICD-10-CM | POA: Insufficient documentation

## 2014-02-16 HISTORY — DX: Dysphagia, unspecified: R13.10

## 2014-02-19 ENCOUNTER — Ambulatory Visit (HOSPITAL_COMMUNITY)
Admission: RE | Admit: 2014-02-19 | Discharge: 2014-02-19 | Disposition: A | Discharge status: Home / Self Care | Payer: Medicare Other | Source: Ambulatory Visit | Attending: Hematology and Oncology | Admitting: Hematology and Oncology

## 2014-02-19 ENCOUNTER — Other Ambulatory Visit: Payer: Self-pay | Admitting: Hematology and Oncology

## 2014-02-19 ENCOUNTER — Encounter: Payer: Self-pay | Admitting: *Deleted

## 2014-02-19 ENCOUNTER — Encounter (HOSPITAL_COMMUNITY): Payer: Self-pay

## 2014-02-19 DIAGNOSIS — E785 Hyperlipidemia, unspecified: Secondary | ICD-10-CM | POA: Insufficient documentation

## 2014-02-19 DIAGNOSIS — Z7982 Long term (current) use of aspirin: Secondary | ICD-10-CM | POA: Insufficient documentation

## 2014-02-19 DIAGNOSIS — I10 Essential (primary) hypertension: Secondary | ICD-10-CM | POA: Insufficient documentation

## 2014-02-19 DIAGNOSIS — C4490 Unspecified malignant neoplasm of skin, unspecified: Secondary | ICD-10-CM

## 2014-02-19 DIAGNOSIS — C4442 Squamous cell carcinoma of skin of scalp and neck: Secondary | ICD-10-CM | POA: Insufficient documentation

## 2014-02-19 DIAGNOSIS — I428 Other cardiomyopathies: Secondary | ICD-10-CM | POA: Insufficient documentation

## 2014-02-19 DIAGNOSIS — G510 Bell's palsy: Secondary | ICD-10-CM | POA: Insufficient documentation

## 2014-02-19 DIAGNOSIS — Z87891 Personal history of nicotine dependence: Secondary | ICD-10-CM | POA: Insufficient documentation

## 2014-02-19 DIAGNOSIS — Z79899 Other long term (current) drug therapy: Secondary | ICD-10-CM | POA: Insufficient documentation

## 2014-02-19 LAB — CBC
HEMATOCRIT: 30.4 % — AB (ref 39.0–52.0)
HEMOGLOBIN: 10.1 g/dL — AB (ref 13.0–17.0)
MCH: 31.4 pg (ref 26.0–34.0)
MCHC: 33.2 g/dL (ref 30.0–36.0)
MCV: 94.4 fL (ref 78.0–100.0)
Platelets: 179 10*3/uL (ref 150–400)
RBC: 3.22 MIL/uL — AB (ref 4.22–5.81)
RDW: 13.4 % (ref 11.5–15.5)
WBC: 4.7 10*3/uL (ref 4.0–10.5)

## 2014-02-19 LAB — APTT: APTT: 30 s (ref 24–37)

## 2014-02-19 LAB — PROTIME-INR
INR: 1.07 (ref 0.00–1.49)
PROTHROMBIN TIME: 13.7 s (ref 11.6–15.2)

## 2014-02-19 LAB — POTASSIUM: Potassium: 3.9 mEq/L (ref 3.7–5.3)

## 2014-02-19 MED ORDER — FENTANYL CITRATE 0.05 MG/ML IJ SOLN
INTRAMUSCULAR | Status: AC | PRN
  Administered 2014-02-19: 100 ug via INTRAVENOUS

## 2014-02-19 MED ORDER — LIDOCAINE HCL 1 % IJ SOLN
INTRAMUSCULAR | Status: AC
  Filled 2014-02-19: qty 20

## 2014-02-19 MED ORDER — LIDOCAINE-EPINEPHRINE (PF) 2 %-1:200000 IJ SOLN
INTRAMUSCULAR | Status: AC
  Filled 2014-02-19: qty 20

## 2014-02-19 MED ORDER — CEFAZOLIN SODIUM-DEXTROSE 2-3 GM-% IV SOLR
2.0000 g | Freq: Once | INTRAVENOUS | Status: AC
  Administered 2014-02-19: 2 g via INTRAVENOUS
  Filled 2014-02-19: qty 50

## 2014-02-19 MED ORDER — SODIUM CHLORIDE 0.9 % IV SOLN: INTRAVENOUS | Status: DC

## 2014-02-19 MED ORDER — MIDAZOLAM HCL 2 MG/2ML IJ SOLN
INTRAMUSCULAR | Status: AC | PRN
  Administered 2014-02-19: 1 mg via INTRAVENOUS
  Administered 2014-02-19: 2 mg via INTRAVENOUS

## 2014-02-19 MED ORDER — HEPARIN SOD (PORK) LOCK FLUSH 100 UNIT/ML IV SOLN: 500.0000 [IU] | Freq: Once | INTRAVENOUS | Status: DC

## 2014-02-19 MED ORDER — FENTANYL CITRATE 0.05 MG/ML IJ SOLN
INTRAMUSCULAR | Status: AC
  Filled 2014-02-19: qty 4

## 2014-02-19 MED ORDER — HEPARIN SOD (PORK) LOCK FLUSH 100 UNIT/ML IV SOLN
INTRAVENOUS | Status: AC
  Filled 2014-02-19: qty 5

## 2014-02-19 MED ORDER — MIDAZOLAM HCL 2 MG/2ML IJ SOLN
INTRAMUSCULAR | Status: AC
  Filled 2014-02-19: qty 4

## 2014-02-19 NOTE — H&P (Signed)
Cory Serrano is an 79 y.o. male.   Chief Complaint: "I'm having a port a cath put in" HPI: Patient with history of  invasive squamous cell carcinoma of the skin with destruction of the right external ear, erosion into the nearby bony structures and 7 th cranial nerve palsy presents today for port a cath placement for chemotherapy.   Past Medical History  Diagnosis Date  . Hyperlipidemia   . BPH (benign prostatic hyperplasia)   . Cardiomyopathy   . Obstructive uropathy   . Bradycardia   . 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 Arthritis     hands per patient  . Cataract     beginning of  . Hx of radiation therapy 10/27/12 -12/15/12    left neck  . Hx of radiation therapy 6/23//14- 07/31/13    R periauricular area, ear, temporal bone, 66 gray 30 fx  . Hypertension   . H/O prostate biopsy     was neg  . Dysphagia 02/16/2014    Past Surgical History  Procedure Laterality Date  . Tonsillectomy    . Hernia repair  8-9 years ago    right inguinal  . Wisdom tooth extraction    . Cardiovascular stress test  2013    copy in [facility]  . Colonoscopy    . Prostate biopsy       neg. 7-8 years ago  . Myringotomy with tube placement Right 09/14/2013    Procedure: MYRINGOTOMY WITH TUBE PLACEMENT;  Surgeon: Melissa Montane, MD;  Location: [facility] OR;  Service: ENT;  Laterality: Right;    Family History  Problem Relation Age of Onset  . Cancer Mother     colon   Social History:  reports that he quit smoking about 47 years ago. His smoking use included Cigarettes. He has a 15 pack-year smoking history. He has never used smokeless tobacco. He reports that he drinks about 9.0 ounces of alcohol per week. He reports that he does not use illicit drugs.  Allergies: No Known Allergies  Current outpatient prescriptions:aspirin 81 MG tablet, Take 81 mg by mouth daily., Disp: , Rfl: ;  atenolol (TENORMIN) 50 MG tablet, Take 50 mg by  mouth daily with lunch. , Disp: , Rfl: ;  Coenzyme Q10 (CO Q 10) 100 MG CAPS, Take 100 mg by mouth 2 (two) times daily., Disp: , Rfl: ;  naproxen sodium (ANAPROX) 220 MG tablet, Take 220 mg by mouth 2 (two) times daily with a meal., Disp: , Rfl:  simvastatin (ZOCOR) 40 MG tablet, Take 40 mg by mouth every evening. , Disp: , Rfl: ;  CIPRODEX otic suspension, Place 4 drops into the right ear 2 (two) times daily. , Disp: , Rfl: ;  lidocaine-prilocaine (EMLA) cream, Apply 1 application topically as needed., Disp: 30 g, Rfl: 0;  ondansetron (ZOFRAN) 8 MG tablet, Take 1 tablet (8 mg total) by mouth every 8 (eight) hours as needed for nausea., Disp: 30 tablet, Rfl: 1 prochlorperazine (COMPAZINE) 10 MG tablet, Take 1 tablet (10 mg total) by mouth every 6 (six) hours as needed (Nausea or vomiting)., Disp: 30 tablet, Rfl: 1 Current facility-administered medications:0.9 %  sodium chloride infusion, , Intravenous, Continuous, Koreen D Morgan, PA-C;  ceFAZolin (ANCEF) IVPB 2 g/50 mL premix, 2 g, Intravenous, Once, Koreen D Morgan, PA-C;  fentaNYL (SUBLIMAZE) 0.05 MG/ML injection, , , , ;  heparin  lock flush 100 UNIT/ML injection, , , , ;  lidocaine (XYLOCAINE) 1 % (with pres) injection, , , , ;  lidocaine-EPINEPHrine (XYLOCAINE W/EPI) 2 %-1:200000 (PF) injection, , , ,  midazolam (VERSED) 2 MG/2ML injection, , , ,  Facility-Administered Medications Ordered in Other Encounters: sodium chloride 0.9 % injection 10 mL, 10 mL, Intravenous, Once, Blair Promise, MD   Results for orders placed during the hospital encounter of 02/19/14 (from the past 48 hour(s))  APTT     Status: None   Collection Time    02/19/14 11:58 AM      Result Value Ref Range   aPTT 30  24 - 37 seconds  CBC     Status: Abnormal   Collection Time    02/19/14 11:58 AM      Result Value Ref Range   WBC 4.7  4.0 - 10.5 K/uL   RBC 3.22 (*) 4.22 - 5.81 MIL/uL   Hemoglobin 10.1 (*) 13.0 - 17.0 g/dL   HCT 30.4 (*) 39.0 - 52.0 %   MCV 94.4  78.0 -  100.0 fL   MCH 31.4  26.0 - 34.0 pg   MCHC 33.2  30.0 - 36.0 g/dL   RDW 13.4  11.5 - 15.5 %   Platelets 179  150 - 400 K/uL  POTASSIUM     Status: None   Collection Time    02/19/14 11:58 AM      Result Value Ref Range   Potassium 3.9  3.7 - 5.3 mEq/L  PROTIME-INR     Status: None   Collection Time    02/19/14 11:58 AM      Result Value Ref Range   Prothrombin Time 13.7  11.6 - 15.2 seconds   INR 1.07  0.00 - 1.49   No results found.  Review of Systems  Constitutional: Positive for weight loss. Negative for fever and chills.  HENT: Positive for ear discharge, ear pain and hearing loss.        Occ HA's  Eyes:       Unable to close rt eye, occ blurriness rt eye  Respiratory: Negative for shortness of breath.        Occ cough  Cardiovascular: Negative for chest pain.  Gastrointestinal: Negative for nausea, vomiting and abdominal pain.       Dysphagia  Musculoskeletal: Negative for back pain.  Neurological:       Rt seventh nerve palsy    Blood pressure 161/79, pulse 62, temperature 97.5 F (36.4 C), temperature source Oral, resp. rate 18, SpO2 100.00%. Physical Exam  Constitutional: He is oriented to person, place, and time.  Thin WM in NAD  HENT:  Destructive lesion/sq cell ca involving rt ext auditory canal  Eyes:  Unable to close rt eye  Cardiovascular: Normal rate and regular rhythm.   Respiratory: Effort normal and breath sounds normal.  GI: Soft. Bowel sounds are normal.  Musculoskeletal: Normal range of motion. He exhibits no edema.  Neurological: He is alert and oriented to person, place, and time.  Rt facial nerve palsy     Assessment/Plan Patient with history of  invasive squamous cell carcinoma of the skin with destruction of the right external ear, erosion into the nearby bony structures and 7 th cranial nerve palsy presents today for port a cath placement for chemotherapy. Details/risks of procedure d/w pt/spouse with their understanding and  consent.   ALLRED,D KEVIN 02/19/2014, 1:01 PM

## 2014-02-19 NOTE — Discharge Instructions (Signed)
Implanted Port Home Guide °An implanted port is a type of central line that is placed under the skin. Central lines are used to provide IV access when treatment or nutrition needs to be given through a person's veins. Implanted ports are used for long-term IV access. An implanted port may be placed because:  °· You need IV medicine that would be irritating to the small veins in your hands or arms.   °· You need long-term IV medicines, such as antibiotics.   °· You need IV nutrition for a long period.   °· You need frequent blood draws for lab tests.   °· You need dialysis.   °Implanted ports are usually placed in the chest area, but they can also be placed in the upper arm, the abdomen, or the leg. An implanted port has two main parts:  °· Reservoir. The reservoir is round and will appear as a small, raised area under your skin. The reservoir is the part where a needle is inserted to give medicines or draw blood.   °· Catheter. The catheter is a thin, flexible tube that extends from the reservoir. The catheter is placed into a large vein. Medicine that is inserted into the reservoir goes into the catheter and then into the vein.   °HOW WILL I CARE FOR MY INCISION SITE? °Do not get the incision site wet. Bathe or shower as directed by your health care provider.  °HOW IS MY PORT ACCESSED? °Special steps must be taken to access the port:  °· Before the port is accessed, a numbing cream can be placed on the skin. This helps numb the skin over the port site.   °· Your health care provider uses a sterile technique to access the port. °· Your health care provider must put on a mask and sterile gloves. °· The skin over your port is cleaned carefully with an antiseptic and allowed to dry. °· The port is gently pinched between sterile gloves, and a needle is inserted into the port. °· Only "non-coring" port needles should be used to access the port. Once the port is accessed, a blood return should be checked. This helps  ensure that the port is in the vein and is not clogged.   °· If your port needs to remain accessed for a constant infusion, a clear (transparent) bandage will be placed over the needle site. The bandage and needle will need to be changed every week, or as directed by your health care provider.   °· Keep the bandage covering the needle clean and dry. Do not get it wet. Follow your health care provider's instructions on how to take a shower or bath while the port is accessed.   °· If your port does not need to stay accessed, no bandage is needed over the port.   °WHAT IS FLUSHING? °Flushing helps keep the port from getting clogged. Follow your health care provider's instructions on how and when to flush the port. Ports are usually flushed with saline solution or a medicine called heparin. The need for flushing will depend on how the port is used.  °· If the port is used for intermittent medicines or blood draws, the port will need to be flushed:   °· After medicines have been given.   °· After blood has been drawn.   °· As part of routine maintenance.   °· If a constant infusion is running, the port may not need to be flushed.   °HOW LONG WILL MY PORT STAY IMPLANTED? °The port can stay in for as long as your health care   provider thinks it is needed. When it is time for the port to come out, surgery will be done to remove it. The procedure is similar to the one performed when the port was put in.  °WHEN SHOULD I SEEK IMMEDIATE MEDICAL CARE? °When you have an implanted port, you should seek immediate medical care if:  °· You notice a bad smell coming from the incision site.   °· You have swelling, redness, or drainage at the incision site.   °· You have more swelling or pain at the port site or the surrounding area.   °· You have a fever that is not controlled with medicine. °Document Released: 12/14/2005 Document Revised: 10/04/2013 Document Reviewed: 08/21/2013 °ExitCare® Patient Information ©2014 [employer]  [employer]. °Moderate Sedation, Adult °Moderate sedation is given to help you relax or even sleep through a procedure. You may remain sleepy, be clumsy, or have poor balance for several hours following this procedure. Arrange for a responsible adult, family member, or friend to take you home. A responsible adult should stay with you for at least 24 hours or until the medicines have worn off. °· Do not participate in any activities where you could become injured for the next 24 hours, or until you feel normal again. Do not: °· Drive. °· Swim. °· Ride a bicycle. °· Operate heavy machinery. °· Cook. °· Use power tools. °· Climb ladders. °· Work at heights. °· Do not make important decisions or sign legal documents until you are improved. °· Vomiting may occur if you eat too soon. When you can drink without vomiting, try water, juice, or soup. Try solid foods if you feel little or no nausea. °· Only take over-the-counter or prescription medications for pain, discomfort, or fever as directed by your caregiver.If pain medications have been prescribed for you, ask your caregiver how soon it is safe to take them. °· Make sure you and your family fully understands everything about the medication given to you. Make sure you understand what side effects may occur. °· You should not drink alcohol, take sleeping pills, or medications that cause drowsiness for at least 24 hours. °· If you smoke, do not smoke alone. °· If you are feeling better, you may resume normal activities 24 hours after receiving sedation. °· Keep all appointments as scheduled. Follow all instructions. °· Ask questions if you do not understand. °SEEK MEDICAL CARE IF:  °· Your skin is pale or bluish in color. °· You continue to feel sick to your stomach (nauseous) or throw up (vomit). °· Your pain is getting worse and not helped by medication. °· You have bleeding or swelling. °· You are still sleepy or feeling clumsy after 24 hours. °SEEK IMMEDIATE MEDICAL CARE IF:   °· You develop a rash. °· You have difficulty breathing. °· You develop any type of allergic problem. °· You have a fever. °Document Released: 09/08/2001 Document Revised: 03/07/2012 Document Reviewed: 08/21/2013 °ExitCare® Patient Information ©2014 [employer]. ° °

## 2014-02-19 NOTE — Procedures (Signed)
Placement of right jugular portacath.  Tip in lower SVC and ready to use.   

## 2014-02-21 ENCOUNTER — Telehealth: Payer: Self-pay | Admitting: *Deleted

## 2014-02-21 ENCOUNTER — Ambulatory Visit (HOSPITAL_BASED_OUTPATIENT_CLINIC_OR_DEPARTMENT_OTHER): Payer: Medicare Other

## 2014-02-21 ENCOUNTER — Encounter: Payer: Self-pay | Admitting: *Deleted

## 2014-02-21 VITALS — BP 153/80 | HR 66 | Temp 96.9°F

## 2014-02-21 DIAGNOSIS — Z5111 Encounter for antineoplastic chemotherapy: Secondary | ICD-10-CM

## 2014-02-21 DIAGNOSIS — C4490 Unspecified malignant neoplasm of skin, unspecified: Secondary | ICD-10-CM

## 2014-02-21 MED ORDER — HEPARIN SOD (PORK) LOCK FLUSH 100 UNIT/ML IV SOLN
500.0000 [IU] | Freq: Once | INTRAVENOUS | Status: AC | PRN
  Administered 2014-02-21: 500 [IU]
  Filled 2014-02-21: qty 5

## 2014-02-21 MED ORDER — SODIUM CHLORIDE 0.9 % IV SOLN
158.4000 mg | Freq: Once | INTRAVENOUS | Status: AC
  Administered 2014-02-21: 160 mg via INTRAVENOUS
  Filled 2014-02-21: qty 16

## 2014-02-21 MED ORDER — SODIUM CHLORIDE 0.9 % IJ SOLN
10.0000 mL | INTRAMUSCULAR | Status: DC | PRN
Start: 2014-02-21 — End: 2014-02-21
  Administered 2014-02-21: 10 mL
  Filled 2014-02-21: qty 10

## 2014-02-21 MED ORDER — DEXAMETHASONE SODIUM PHOSPHATE 10 MG/ML IJ SOLN
10.0000 mg | Freq: Once | INTRAMUSCULAR | Status: AC
  Administered 2014-02-21: 10 mg via INTRAVENOUS

## 2014-02-21 MED ORDER — DEXAMETHASONE SODIUM PHOSPHATE 10 MG/ML IJ SOLN
INTRAMUSCULAR | Status: AC
  Filled 2014-02-21: qty 1

## 2014-02-21 MED ORDER — ONDANSETRON 8 MG/NS 50 ML IVPB
INTRAVENOUS | Status: AC
  Filled 2014-02-21: qty 8

## 2014-02-21 MED ORDER — ONDANSETRON 8 MG/50ML IVPB (CHCC)
8.0000 mg | Freq: Once | INTRAVENOUS | Status: AC
  Administered 2014-02-21: 8 mg via INTRAVENOUS

## 2014-02-21 MED ORDER — SODIUM CHLORIDE 0.9 % IV SOLN
Freq: Once | INTRAVENOUS | Status: AC
  Administered 2014-02-21: 10:00:00 via INTRAVENOUS

## 2014-02-21 NOTE — Progress Notes (Signed)
Met with patient as he finished initial infusion.  Pt stated "went well"; attending RN confirmed this.  Pt verbalized he is proceeding with speech evaluation and has several appointments scheduled.   He denied any needs or concerns.  Continuing navigation as L1 patient (new patient).  Gayleen Orem, RN, BSN, [facility] Neck Oncology Navigator [phone]

## 2014-02-21 NOTE — Telephone Encounter (Signed)
Called pt at home for post chemo follow up call today.  Pt stated he was doing fine after chemo.  Went over instructions on taking antiemetics;  Pushed po fluids as tolerated.  Pt understood to call office with any problems.  Pt also aware of next returned appts.

## 2014-02-21 NOTE — Patient Instructions (Signed)
[facility] Discharge Instructions for Patients Receiving Chemotherapy  Today you received the following chemotherapy agents: Carboplatin  To help prevent nausea and vomiting after your treatment, we encourage you to take your nausea medication as prescribed.    If you develop nausea and vomiting that is not controlled by your nausea medication, call the clinic.   BELOW ARE SYMPTOMS THAT SHOULD BE REPORTED IMMEDIATELY:  *FEVER GREATER THAN 100.5 F  *CHILLS WITH OR WITHOUT FEVER  NAUSEA AND VOMITING THAT IS NOT CONTROLLED WITH YOUR NAUSEA MEDICATION  *UNUSUAL SHORTNESS OF BREATH  *UNUSUAL BRUISING OR BLEEDING  TENDERNESS IN MOUTH AND THROAT WITH OR WITHOUT PRESENCE OF ULCERS  *URINARY PROBLEMS  *BOWEL PROBLEMS  UNUSUAL RASH Items with * indicate a potential emergency and should be followed up as soon as possible.  Feel free to call the clinic you have any questions or concerns. The clinic phone number is [phone].   Carboplatin injection What is this medicine? CARBOPLATIN (KAR boe pla tin) is a chemotherapy drug. It targets fast dividing cells, like cancer cells, and causes these cells to die. This medicine is used to treat ovarian cancer and many other cancers. This medicine may be used for other purposes; ask your health care provider or pharmacist if you have questions. COMMON BRAND NAME(S): Paraplatin What should I tell my health care provider before I take this medicine? They need to know if you have any of these conditions: -blood disorders -hearing problems -kidney disease -recent or ongoing radiation therapy -an unusual or allergic reaction to carboplatin, cisplatin, other chemotherapy, other medicines, foods, dyes, or preservatives -pregnant or trying to get pregnant -breast-feeding How should I use this medicine? This drug is usually given as an infusion into a vein. It is administered in a hospital or clinic by a specially trained health  care professional. Talk to your pediatrician regarding the use of this medicine in children. Special care may be needed. Overdosage: If you think you have taken too much of this medicine contact a poison control center or emergency room at once. NOTE: This medicine is only for you. Do not share this medicine with others. What if I miss a dose? It is important not to miss a dose. Call your doctor or health care professional if you are unable to keep an appointment. What may interact with this medicine? -medicines for seizures -medicines to increase blood counts like filgrastim, pegfilgrastim, sargramostim -some antibiotics like amikacin, gentamicin, neomycin, streptomycin, tobramycin -vaccines Talk to your doctor or health care professional before taking any of these medicines: -acetaminophen -aspirin -ibuprofen -ketoprofen -naproxen This list may not describe all possible interactions. Give your health care provider a list of all the medicines, herbs, non-prescription drugs, or dietary supplements you use. Also tell them if you smoke, drink alcohol, or use illegal drugs. Some items may interact with your medicine. What should I watch for while using this medicine? Your condition will be monitored carefully while you are receiving this medicine. You will need important blood work done while you are taking this medicine. This drug may make you feel generally unwell. This is not uncommon, as chemotherapy can affect healthy cells as well as cancer cells. Report any side effects. Continue your course of treatment even though you feel ill unless your doctor tells you to stop. In some cases, you may be given additional medicines to help with side effects. Follow all directions for their use. Call your doctor or health care professional for advice if you   get a fever, chills or sore throat, or other symptoms of a cold or flu. Do not treat yourself. This drug decreases your body's ability to fight  infections. Try to avoid being around people who are sick. This medicine may increase your risk to bruise or bleed. Call your doctor or health care professional if you notice any unusual bleeding. Be careful brushing and flossing your teeth or using a toothpick because you may get an infection or bleed more easily. If you have any dental work done, tell your dentist you are receiving this medicine. Avoid taking products that contain aspirin, acetaminophen, ibuprofen, naproxen, or ketoprofen unless instructed by your doctor. These medicines may hide a fever. Do not become pregnant while taking this medicine. Women should inform their doctor if they wish to become pregnant or think they might be pregnant. There is a potential for serious side effects to an unborn child. Talk to your health care professional or pharmacist for more information. Do not breast-feed an infant while taking this medicine. What side effects may I notice from receiving this medicine? Side effects that you should report to your doctor or health care professional as soon as possible: -allergic reactions like skin rash, itching or hives, swelling of the face, lips, or tongue -signs of infection - fever or chills, cough, sore throat, pain or difficulty passing urine -signs of decreased platelets or bleeding - bruising, pinpoint red spots on the skin, black, tarry stools, nosebleeds -signs of decreased red blood cells - unusually weak or tired, fainting spells, lightheadedness -breathing problems -changes in hearing -changes in vision -chest pain -high blood pressure -low blood counts - This drug may decrease the number of white blood cells, red blood cells and platelets. You may be at increased risk for infections and bleeding. -nausea and vomiting -pain, swelling, redness or irritation at the injection site -pain, tingling, numbness in the hands or feet -problems with balance, talking, walking -trouble passing urine or change  in the amount of urine Side effects that usually do not require medical attention (report to your doctor or health care professional if they continue or are bothersome): -hair loss -loss of appetite -metallic taste in the mouth or changes in taste This list may not describe all possible side effects. Call your doctor for medical advice about side effects. You may report side effects to FDA at 1-800-FDA-1088. Where should I keep my medicine? This drug is given in a hospital or clinic and will not be stored at home. NOTE: This sheet is a summary. It may not cover all possible information. If you have questions about this medicine, talk to your doctor, pharmacist, or health care provider.  2014, Elsevier/Gold Standard. (2008-03-20 14:38:05)  

## 2014-02-22 ENCOUNTER — Other Ambulatory Visit (HOSPITAL_COMMUNITY): Payer: Self-pay | Admitting: Hematology and Oncology

## 2014-02-22 ENCOUNTER — Telehealth: Payer: Self-pay | Admitting: *Deleted

## 2014-02-22 DIAGNOSIS — R1310 Dysphagia, unspecified: Secondary | ICD-10-CM

## 2014-02-22 NOTE — Telephone Encounter (Signed)
Called Cory Serrano for chemotherapy F/U.  Spoke with wife Cory Serrano who reports he is not available at this time.  Patient is doing well.  Denies n/v.  Denies any new side effects or symptoms.  Bowel and bladder is functioning well.  Eating and drinking well and I instructed to drink 64 oz minimum daily or at least the day before, of and after treatment.  Denies questions at this time and encouraged to call if needed.  Reviewed how to call after hours in the case of an emergency.

## 2014-02-22 NOTE — Telephone Encounter (Signed)
Message copied by Cherylynn Ridges on Thu Feb 22, 2014 11:37 AM ------      Message from: Perlie Gold      Created: Wed Feb 21, 2014 10:19 AM      Regarding: Chemo Follow up call      Contact: [phone]       First time Carboplatin. Dr. Alvy Bimler patient. Please call.             Thanks,       Barnetta Chapel, RN ------

## 2014-02-27 ENCOUNTER — Ambulatory Visit: Payer: Medicare Other | Attending: Hematology and Oncology | Admitting: Speech Pathology

## 2014-02-27 DIAGNOSIS — R1310 Dysphagia, unspecified: Secondary | ICD-10-CM | POA: Insufficient documentation

## 2014-02-27 DIAGNOSIS — R4701 Aphasia: Secondary | ICD-10-CM | POA: Insufficient documentation

## 2014-02-28 ENCOUNTER — Encounter: Payer: Self-pay | Admitting: Hematology and Oncology

## 2014-02-28 ENCOUNTER — Other Ambulatory Visit (HOSPITAL_BASED_OUTPATIENT_CLINIC_OR_DEPARTMENT_OTHER): Payer: Medicare Other

## 2014-02-28 ENCOUNTER — Ambulatory Visit (HOSPITAL_BASED_OUTPATIENT_CLINIC_OR_DEPARTMENT_OTHER): Payer: Medicare Other

## 2014-02-28 ENCOUNTER — Ambulatory Visit (HOSPITAL_BASED_OUTPATIENT_CLINIC_OR_DEPARTMENT_OTHER): Payer: Medicare Other | Admitting: Hematology and Oncology

## 2014-02-28 ENCOUNTER — Encounter: Payer: Self-pay | Admitting: *Deleted

## 2014-02-28 VITALS — BP 121/65 | HR 59 | Temp 97.7°F | Resp 18 | Ht 70.0 in | Wt 130.1 lb

## 2014-02-28 DIAGNOSIS — D649 Anemia, unspecified: Secondary | ICD-10-CM

## 2014-02-28 DIAGNOSIS — C4490 Unspecified malignant neoplasm of skin, unspecified: Secondary | ICD-10-CM

## 2014-02-28 DIAGNOSIS — R1310 Dysphagia, unspecified: Secondary | ICD-10-CM

## 2014-02-28 DIAGNOSIS — Z5111 Encounter for antineoplastic chemotherapy: Secondary | ICD-10-CM

## 2014-02-28 DIAGNOSIS — C4492 Squamous cell carcinoma of skin, unspecified: Secondary | ICD-10-CM

## 2014-02-28 DIAGNOSIS — R634 Abnormal weight loss: Secondary | ICD-10-CM

## 2014-02-28 LAB — CBC WITH DIFFERENTIAL/PLATELET
BASO%: 0.4 % (ref 0.0–2.0)
BASOS ABS: 0 10*3/uL (ref 0.0–0.1)
EOS ABS: 0 10*3/uL (ref 0.0–0.5)
EOS%: 0.8 % (ref 0.0–7.0)
HEMATOCRIT: 27.4 % — AB (ref 38.4–49.9)
HEMOGLOBIN: 9.1 g/dL — AB (ref 13.0–17.1)
LYMPH#: 0.4 10*3/uL — AB (ref 0.9–3.3)
LYMPH%: 7.3 % — ABNORMAL LOW (ref 14.0–49.0)
MCH: 31.7 pg (ref 27.2–33.4)
MCHC: 33.3 g/dL (ref 32.0–36.0)
MCV: 95.2 fL (ref 79.3–98.0)
MONO#: 0.6 10*3/uL (ref 0.1–0.9)
MONO%: 9.8 % (ref 0.0–14.0)
NEUT%: 81.7 % — AB (ref 39.0–75.0)
NEUTROS ABS: 4.8 10*3/uL (ref 1.5–6.5)
Platelets: 220 10*3/uL (ref 140–400)
RBC: 2.88 10*6/uL — ABNORMAL LOW (ref 4.20–5.82)
RDW: 13.8 % (ref 11.0–14.6)
WBC: 5.9 10*3/uL (ref 4.0–10.3)

## 2014-02-28 LAB — COMPREHENSIVE METABOLIC PANEL (CC13)
ALBUMIN: 2.6 g/dL — AB (ref 3.5–5.0)
ALT: 8 U/L (ref 0–55)
AST: 12 U/L (ref 5–34)
Alkaline Phosphatase: 77 U/L (ref 40–150)
Anion Gap: 6 mEq/L (ref 3–11)
BUN: 16.1 mg/dL (ref 7.0–26.0)
CALCIUM: 9.3 mg/dL (ref 8.4–10.4)
CHLORIDE: 103 meq/L (ref 98–109)
CO2: 31 meq/L — AB (ref 22–29)
CREATININE: 0.8 mg/dL (ref 0.7–1.3)
Glucose: 81 mg/dl (ref 70–140)
Potassium: 4 mEq/L (ref 3.5–5.1)
Sodium: 140 mEq/L (ref 136–145)
Total Bilirubin: 0.38 mg/dL (ref 0.20–1.20)
Total Protein: 6.1 g/dL — ABNORMAL LOW (ref 6.4–8.3)

## 2014-02-28 LAB — MAGNESIUM (CC13): Magnesium: 1.9 mg/dl (ref 1.5–2.5)

## 2014-02-28 MED ORDER — SODIUM CHLORIDE 0.9 % IV SOLN
Freq: Once | INTRAVENOUS | Status: AC
  Administered 2014-02-28: 14:00:00 via INTRAVENOUS

## 2014-02-28 MED ORDER — ONDANSETRON 8 MG/NS 50 ML IVPB
INTRAVENOUS | Status: AC
  Filled 2014-02-28: qty 8

## 2014-02-28 MED ORDER — DEXAMETHASONE SODIUM PHOSPHATE 10 MG/ML IJ SOLN
10.0000 mg | Freq: Once | INTRAMUSCULAR | Status: AC
  Administered 2014-02-28: 10 mg via INTRAVENOUS

## 2014-02-28 MED ORDER — HEPARIN SOD (PORK) LOCK FLUSH 100 UNIT/ML IV SOLN
500.0000 [IU] | Freq: Once | INTRAVENOUS | Status: AC | PRN
  Administered 2014-02-28: 500 [IU]
  Filled 2014-02-28: qty 5

## 2014-02-28 MED ORDER — SODIUM CHLORIDE 0.9 % IJ SOLN
10.0000 mL | INTRAMUSCULAR | Status: DC | PRN
  Administered 2014-02-28: 10 mL
  Filled 2014-02-28: qty 10

## 2014-02-28 MED ORDER — ONDANSETRON 8 MG/50ML IVPB (CHCC)
8.0000 mg | Freq: Once | INTRAVENOUS | Status: AC
  Administered 2014-02-28: 8 mg via INTRAVENOUS

## 2014-02-28 MED ORDER — SODIUM CHLORIDE 0.9 % IV SOLN
158.4000 mg | Freq: Once | INTRAVENOUS | Status: AC
  Administered 2014-02-28: 160 mg via INTRAVENOUS
  Filled 2014-02-28: qty 16

## 2014-02-28 MED ORDER — DEXAMETHASONE SODIUM PHOSPHATE 10 MG/ML IJ SOLN
INTRAMUSCULAR | Status: AC
  Filled 2014-02-28: qty 1

## 2014-02-28 NOTE — Progress Notes (Signed)
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, [id number]     Skin cancer   08/25/2012 Imaging PET Ct showed LN on the left side of neck   10/27/2012 - 12/15/2012 Radiation Therapy Patient underwent palliative radiation to the left neck for LN involvement, presumed metastatic squamous cell carcinoma from invasive skin cancer   05/10/2013 Procedure Biopsy of the mass near the right parotid confirmed squamous cell carcinoma   06/19/2013 - 07/31/2013 Radiation Therapy He underwent radiation to the right ear and mastoid region for recurrent cancer   09/14/2013 Surgery He underwent myringotomy and placement of tube to right inner ear   01/31/2014 Imaging Ct imaging showed extensive erosion of the cancer involving the right ear and mastoid bone. PET CTs scan also confirmed LN involvement on the right cervical LN    INTERVAL HISTORY: Cory Serrano 79 y.o. male returns for further followup. He is doing well since the last time I saw him. He still have occasional pain on his right face. He still have persistent right-sided hearing deficit, difficulties with chewing food and swallowing. He tolerated chemotherapy well without side effects. He has lost a bit of weight. He has started aggressive eye care and felt slight improvement.  I have reviewed the past medical history, past surgical history, social history and family history with the patient and they are unchanged from previous note.  ALLERGIES:  has No Known Allergies.  MEDICATIONS:  Current Outpatient Prescriptions  Medication Sig Dispense Refill  . aspirin 81 MG tablet Take 81 mg by mouth daily.      [name] atenolol (TENORMIN) 50 MG  tablet Take 50 mg by mouth daily with lunch.       [name] CIPRODEX otic suspension Place 4 drops into the right ear 2 (two) times daily.       . Coenzyme Q10 (CO Q 10) 100 MG CAPS Take 100 mg by mouth 2 (two) times daily.      [name] lidocaine-prilocaine (EMLA) cream Apply 1 application topically as needed.  30 g  0  . naproxen sodium (ANAPROX) 220 MG tablet Take 220 mg by mouth 2 (two) times daily with a meal.      . ondansetron (ZOFRAN) 8 MG tablet Take 1 tablet (8 mg total) by mouth every 8 (eight) hours as needed for nausea.  30 tablet  1  . prochlorperazine (COMPAZINE) 10 MG tablet Take 1 tablet (10 mg total) by mouth every 6 (six) hours as needed (Nausea or vomiting).  30 tablet  1  . simvastatin (ZOCOR) 40 MG tablet Take 40 mg by mouth every evening.        No current facility-administered medications for this visit.   Facility-Administered Medications Ordered in Other Visits  Medication Dose Route Frequency Provider Last Rate Last Dose  . sodium chloride 0.9 % injection 10 mL  10 mL Intravenous Once Blair Promise, MD      . sodium chloride 0.9 % injection 10 mL  10 mL Intracatheter PRN Heath Lark, MD   10 mL at 02/28/14 1452    REVIEW OF SYSTEMS:   Constitutional: Denies fevers, chills Eyes: Denies blurriness of vision Ears, nose, mouth, throat, and face: Denies mucositis or sore throat Respiratory: Denies  cough, dyspnea or wheezes Cardiovascular: Denies palpitation, chest discomfort or lower extremity swelling Gastrointestinal:  Denies nausea, heartburn or change in bowel habits Skin: Denies abnormal skin rashes Lymphatics: Denies new lymphadenopathy or easy bruising Neurological:Denies numbness, tingling or new weaknesses Behavioral/Psych: Mood is stable, no new changes  All other systems were reviewed with the patient and are negative.  PHYSICAL EXAMINATION: ECOG PERFORMANCE STATUS: 1 - Symptomatic but completely ambulatory  Filed Vitals:   02/28/14 1222  BP: 121/65  Pulse: 59   Temp: 97.7 F (36.5 C)  Resp: 18   Filed Weights   02/28/14 1222  Weight: 130 lb 1.6 oz (59.013 kg)    GENERAL:alert, no distress and comfortable he looks thin and mildly cachectic SKIN: skin color, texture, turgor are normal, no rashes or significant lesions EYES: normal, Conjunctiva are pink and non-injected, sclera clear OROPHARYNX:no exudate, no erythema and lips, buccal mucosa, and tongue normal  NECK: supple, thyroid normal size, non-tender, without nodularity. Significant destruction of his right external ear, slightly improved compared to previous visit LYMPH:  Palpable lymphadenopathy in the right auricular area, no change in size  LUNGS: clear to auscultation and percussion with normal breathing effort HEART: regular rate & rhythm and no murmurs and no lower extremity edema ABDOMEN:abdomen soft, non-tender and normal bowel sounds Musculoskeletal:no cyanosis of digits and no clubbing  NEURO: alert & oriented x 3 with mild dysarthria, persistent right-sided facial paralysis LABORATORY DATA:  I have reviewed the data as listed    Component Value Date/Time   NA 140 02/28/2014 1316   NA 137 09/13/2013 1535   K 4.0 02/28/2014 1316   K 3.9 02/19/2014 1158   CL 103 09/13/2013 1535   CL 101 06/07/2013 0855   CO2 31* 02/28/2014 1316   CO2 27 09/13/2013 1535   GLUCOSE 81 02/28/2014 1316   GLUCOSE 117* 09/13/2013 1535   GLUCOSE 82 06/07/2013 0855   BUN 16.1 02/28/2014 1316   BUN 14 09/13/2013 1535   CREATININE 0.8 02/28/2014 1316   CREATININE 1.00 01/31/2014 0844   CALCIUM 9.3 02/28/2014 1316   CALCIUM 9.3 09/13/2013 1535   PROT 6.1* 02/28/2014 1316   ALBUMIN 2.6* 02/28/2014 1316   AST 12 02/28/2014 1316   ALT 8 02/28/2014 1316   ALKPHOS 77 02/28/2014 1316   BILITOT 0.38 02/28/2014 1316   GFRNONAA 84* 09/13/2013 1535   GFRAA >90 09/13/2013 1535    No results found for this basename: SPEP,  UPEP,   kappa and lambda light chains    Lab Results  Component Value Date   WBC 5.9 02/28/2014   NEUTROABS 4.8  02/28/2014   HGB 9.1* 02/28/2014   HCT 27.4* 02/28/2014   MCV 95.2 02/28/2014   PLT 220 02/28/2014      Chemistry      Component Value Date/Time   NA 140 02/28/2014 1316   NA 137 09/13/2013 1535   K 4.0 02/28/2014 1316   K 3.9 02/19/2014 1158   CL 103 09/13/2013 1535   CL 101 06/07/2013 0855   CO2 31* 02/28/2014 1316   CO2 27 09/13/2013 1535   BUN 16.1 02/28/2014 1316   BUN 14 09/13/2013 1535   CREATININE 0.8 02/28/2014 1316   CREATININE 1.00 01/31/2014 0844      Component Value Date/Time   CALCIUM 9.3 02/28/2014 1316   CALCIUM 9.3 09/13/2013 1535   ALKPHOS 77 02/28/2014 1316   AST 12 02/28/2014 1316   ALT 8 02/28/2014 1316   BILITOT 0.38 02/28/2014 1316  ASSESSMENT & PLAN:  #1 Invasive squamous cell carcinoma of the skin with destruction of the right external ear, erosion into the nearby bony structures and 7 th cranial nerve palsy Overall, he is improving on clinical examination. I recommend he continue weekly chemotherapy as scheduled. #2 anemia This is likely due to recent treatment. The patient denies recent history of bleeding such as epistaxis, hematuria or hematochezia. He is asymptomatic from the anemia. I will observe for now.  He does not require transfusion now. I will continue the chemotherapy at current dose without dosage adjustment.  If the anemia gets progressive worse in the future, I might have to delay his treatment or adjust the chemotherapy dose. `#3 Weight loss and dysphagia He has been referred to speech therapist for speech and swallow assessment and nutritional consult for weight loss. Recommended patient to increase oral intake as tolerated #4 right cranial nerve palsy In order to protect his right eye, I recommend regular over the counter topical eye drops for lubrication and to wear and patch over the right eye to close it. He's doing well with the aggressive eye care #5 right ear pain pain He will continue over the counter analgesia All questions were answered. The patient knows  to call the clinic with any problems, questions or concerns. No barriers to learning was detected. I spent 25 minutes counseling the patient face to face. The total time spent in the appointment was 40 minutes and more than 50% was on counseling and review of test results     [facility], [place], MD 02/28/2014 4:44 PM

## 2014-02-28 NOTE — Progress Notes (Signed)
To provide support and maintain continuity of care, met with patient and his wife during scheduled appt with Dr. Alvy Bimler.  Patient denied any needs at this time.  I will continue to navigate this patient closely and provide as much support as possible in order to mitigate expressed concerns.  Pt wife expressed appreciation for the support system they are experiencing at [facility].  She stated she had no support when she went through breast cancer treatment years ago (at another facility).  Continuing navigation as L1 patient (new patient).  Gayleen Orem, RN, BSN, [facility] Head [facility] Neck Oncology Navigator [phone]

## 2014-03-01 ENCOUNTER — Ambulatory Visit: Payer: Medicare Other

## 2014-03-01 ENCOUNTER — Ambulatory Visit (HOSPITAL_COMMUNITY)
Admission: RE | Admit: 2014-03-01 | Discharge: 2014-03-01 | Disposition: A | Discharge status: Home / Self Care | Payer: Medicare Other | Source: Ambulatory Visit | Attending: Hematology and Oncology | Admitting: Hematology and Oncology

## 2014-03-01 DIAGNOSIS — R1310 Dysphagia, unspecified: Secondary | ICD-10-CM

## 2014-03-01 NOTE — Procedures (Addendum)
Objective Swallowing Evaluation: Modified Barium Swallowing Study  Patient Details  Name: Cory Serrano MRN: [id number] Date of Birth: [birth year]  Today's Date: 03/01/2014 Time: 9485-4627 SLP Time Calculation (min): 28 min  Past Medical History:  Past Medical History  Diagnosis Date  . Hyperlipidemia   . BPH (benign prostatic hyperplasia)   . Cardiomyopathy   . Obstructive uropathy   . Bradycardia   . 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 Arthritis     hands per patient  . Cataract     beginning of  . Hx of radiation therapy 10/27/12 -12/15/12    left neck  . Hx of radiation therapy 6/23//14- 07/31/13    R periauricular area, ear, temporal bone, 66 gray 30 fx  . Hypertension   . H/O prostate biopsy     was neg  . Dysphagia 02/16/2014   Past Surgical History:  Past Surgical History  Procedure Laterality Date  . Tonsillectomy    . Hernia repair  8-9 years ago    right inguinal  . Wisdom tooth extraction    . Cardiovascular stress test  2013    copy in [facility]  . Colonoscopy    . Prostate biopsy       neg. 7-8 years ago  . Myringotomy with tube placement Right 09/14/2013    Procedure: MYRINGOTOMY WITH TUBE PLACEMENT;  Surgeon: Melissa Montane, MD;  Location: [facility] OR;  Service: ENT;  Laterality: Right;   HPI:  Pt is a 79 yo male referred for MBS due to dysphagia symptoms.  PMH + for left neck radiation tx November through December 2013 and right neck radiationt x June - Aug 2014.  Pt reports sensation of liquids going down the wrong way at times.  Pt has upper and lower CN 7 deficits resulting in right eye, facial, labial flaccidity.  He uses an eye patch at night.  Pt states his swallow ability is better now and he is maintaining his weight after experiencing some weight loss.  Xerostomia significant issue.       Assessment / Plan / Recommendation Clinical Impression  Dysphagia Diagnosis: Moderate oral  phase dysphagia   Clinical impression: Moderate oral and minimal oropharyngeal dysphagia without aspiration or penetration of any consistency tested.   Pt placed boluses on left side of his oral cavity and used a straw due to CN 7 deficits impacting labial seal.  Anterior labial spill of liquids minimal on right.   Xerostomia also noted impacting oral transiiting for which pt compensated by elevating head upward to use gravity to his benefit.  SLP advised caution with strategy due to opening up airway.  Decreased intraoral pressure results in minimal tongue base and pharyngeal stasis - of which pt senses larger amounts.  Cued effortful swallow and following solids with liquids faciliated pharyngeal clearance.  Barium tablet was swallowed adequately with thin liquids.  Advised pt to mitigation strategies including oral rinsing after meals, extra sauces/gravies with meals, starting meal with liquids, following solids with liquids, and intermittent dry swallows to compensate for dysphagia.  ? Some edema in neck region on xray- radiologist not present to confirm.   Recommend continue Outpt SLP to maximize swallow ability.     Treatment Recommendation  Defer treatment plan to SLP at (Comment)    Diet Recommendation Dysphagia 3 (Mechanical Soft);Regular;Thin liquid   Liquid Administration via: Straw Medication Administration:  Whole meds with liquid Supervision: Patient able to self feed Compensations: Slow rate;Small sips/bites;Follow solids with liquid;Effortful swallow (intermittent dry swallow, start meal with liquids) Postural Changes and/or Swallow Maneuvers: Seated upright 90 degrees;Upright 30-60 min after meal    Other  Recommendations Oral Care Recommendations: Oral care before and after PO   Follow Up Recommendations  Outpatient SLP         General Date of Onset: 03/01/14 HPI: Pt is a 79 yo male referred for MBS due to dysphagia symptoms.  PMH + for left neck radiation tx November  through December 2013 and right neck radiationt x June - Aug 2014.  Pt reports sensation of liquids going down the wrong way at times.  Pt has upper and lower CN 7 deficits resulting in right eye, facial, labial flaccidity.  He uses an eye patch at night.  Pt states his swallow ability is better now and he is maintaining his weight after experiencing some weight loss.  Xerostomia significant issue.   Type of Study: Modified Barium Swallowing Study Reason for Referral: Objectively evaluate swallowing function Diet Prior to this Study: Dysphagia 3 (soft);Thin liquids Respiratory Status: Room air Behavior/Cognition: Alert;Cooperative;Pleasant mood Oral Cavity - Dentition: Adequate natural dentition Oral Motor / Sensory Function: Impaired motor;Impaired sensory Oral impairment: Right facial;Right lingual;Right labial Self-Feeding Abilities: Able to feed self Patient Positioning: Upright in chair Baseline Vocal Quality: Other (comment);Wet (cleared with throat clear/cough) Volitional Cough: Strong Volitional Swallow: Able to elicit Anatomy: Other (Comment) Pharyngeal Secretions: Not observed secondary MBS    Reason for Referral Objectively evaluate swallowing function   Oral Phase Oral Preparation/Oral Phase Oral Phase: Impaired Oral - Nectar Oral - Nectar Straw: Reduced posterior propulsion;Right anterior bolus loss;Weak lingual manipulation;Lingual/palatal residue Oral - Thin Oral - Thin Straw: Reduced posterior propulsion;Right anterior bolus loss;Weak lingual manipulation;Lingual/palatal residue Oral - Solids Oral - Puree: Reduced posterior propulsion;Weak lingual manipulation;Lingual/palatal residue Oral - Regular: Reduced posterior propulsion;Weak lingual manipulation Oral - Pill: Reduced posterior propulsion;Weak lingual manipulation Oral Phase - Comment Oral Phase - Comment: pt placed boluses on left side due to CN 7 deficits, pt elevates head upward to aid oral transiting of boluses     Pharyngeal Phase Pharyngeal Phase Pharyngeal Phase: Impaired Pharyngeal - Nectar Pharyngeal - Nectar Straw: Pharyngeal residue - valleculae;Pharyngeal residue - pyriform sinuses;Reduced tongue base retraction Pharyngeal - Thin Pharyngeal - Thin Straw: Pharyngeal residue - valleculae;Pharyngeal residue - pyriform sinuses;Reduced tongue base retraction Pharyngeal - Solids Pharyngeal - Puree: Pharyngeal residue - valleculae;Pharyngeal residue - pyriform sinuses;Reduced tongue base retraction Pharyngeal - Regular: Within functional limits;Reduced tongue base retraction Pharyngeal - Pill: Within functional limits;Reduced tongue base retraction Pharyngeal Phase - Comment Pharyngeal Comment: effortful swallow with larger bolus of pudding faciliated pharyngeal clearance,  trace stasis cleared effectively with cued dry swallows  Cervical Esophageal Phase    GO    Cervical Esophageal Phase Cervical Esophageal Phase: Impaired Cervical Esophageal Phase - Nectar Nectar Straw: Reduced cricopharyngeal relaxation Cervical Esophageal Phase - Thin Thin Straw: Reduced cricopharyngeal relaxation Cervical Esophageal Phase - Solids Puree: Reduced cricopharyngeal relaxation Regular: Reduced cricopharyngeal relaxation Pill: Reduced cricopharyngeal relaxation Cervical Esophageal Phase - Comment Cervical Esophageal Comment: only trace amount of stasis noted near pyriform sinus region, effectively clearing with dry swallow    Functional Assessment Tool Used: mbs, clinical judgement Functional Limitations: Swallowing Swallow Current Status [id number]): At least 20 percent but less than 40 percent impaired, limited or restricted Swallow Goal Status [phone]): At least 20 percent but less than 40 percent impaired, limited  or restricted Swallow Discharge Status [phone]): At least 20 percent but less than 40 percent impaired, limited or restricted    Luanna Salk, [facility] SLP [phone]

## 2014-03-01 NOTE — Progress Notes (Signed)
To provide support and encouragement, met with patient and his wife in [place] [facility] where he had arrived for [facility] placement.  I provided additional education re: the purpose of a port and its value to his treatment plan.  He and his wife verbalized understanding.  Continuing navigation as L1 patient (new patient).  Gayleen Orem, RN, BSN, [facility] Ne[facility] On[facility] Navigator [phone]

## 2014-03-02 ENCOUNTER — Telehealth: Payer: Self-pay | Admitting: Hematology and Oncology

## 2014-03-06 ENCOUNTER — Ambulatory Visit: Payer: Medicare Other | Admitting: Speech Pathology

## 2014-03-07 ENCOUNTER — Other Ambulatory Visit (HOSPITAL_BASED_OUTPATIENT_CLINIC_OR_DEPARTMENT_OTHER): Payer: Medicare Other

## 2014-03-07 ENCOUNTER — Encounter: Payer: Self-pay | Admitting: *Deleted

## 2014-03-07 ENCOUNTER — Ambulatory Visit (HOSPITAL_BASED_OUTPATIENT_CLINIC_OR_DEPARTMENT_OTHER): Payer: Medicare Other

## 2014-03-07 VITALS — BP 148/79 | HR 57 | Temp 96.9°F

## 2014-03-07 DIAGNOSIS — C4490 Unspecified malignant neoplasm of skin, unspecified: Secondary | ICD-10-CM

## 2014-03-07 DIAGNOSIS — Z5111 Encounter for antineoplastic chemotherapy: Secondary | ICD-10-CM

## 2014-03-07 DIAGNOSIS — C4492 Squamous cell carcinoma of skin, unspecified: Secondary | ICD-10-CM

## 2014-03-07 LAB — CBC WITH DIFFERENTIAL/PLATELET
BASO%: 0.2 % (ref 0.0–2.0)
BASOS ABS: 0 10*3/uL (ref 0.0–0.1)
EOS%: 0.7 % (ref 0.0–7.0)
Eosinophils Absolute: 0 10*3/uL (ref 0.0–0.5)
HCT: 27.4 % — ABNORMAL LOW (ref 38.4–49.9)
HEMOGLOBIN: 9.3 g/dL — AB (ref 13.0–17.1)
LYMPH%: 9.5 % — ABNORMAL LOW (ref 14.0–49.0)
MCH: 31.4 pg (ref 27.2–33.4)
MCHC: 33.9 g/dL (ref 32.0–36.0)
MCV: 92.6 fL (ref 79.3–98.0)
MONO#: 0.5 10*3/uL (ref 0.1–0.9)
MONO%: 8.8 % (ref 0.0–14.0)
NEUT#: 4.7 10*3/uL (ref 1.5–6.5)
NEUT%: 80.8 % — ABNORMAL HIGH (ref 39.0–75.0)
PLATELETS: 160 10*3/uL (ref 140–400)
RBC: 2.96 10*6/uL — ABNORMAL LOW (ref 4.20–5.82)
RDW: 13.8 % (ref 11.0–14.6)
WBC: 5.8 10*3/uL (ref 4.0–10.3)
lymph#: 0.6 10*3/uL — ABNORMAL LOW (ref 0.9–3.3)

## 2014-03-07 LAB — COMPREHENSIVE METABOLIC PANEL (CC13)
ALBUMIN: 2.7 g/dL — AB (ref 3.5–5.0)
ALK PHOS: 72 U/L (ref 40–150)
ALT: 9 U/L (ref 0–55)
AST: 11 U/L (ref 5–34)
Anion Gap: 8 mEq/L (ref 3–11)
BUN: 16.5 mg/dL (ref 7.0–26.0)
CALCIUM: 9.4 mg/dL (ref 8.4–10.4)
CHLORIDE: 103 meq/L (ref 98–109)
CO2: 26 meq/L (ref 22–29)
Creatinine: 0.7 mg/dL (ref 0.7–1.3)
GLUCOSE: 105 mg/dL (ref 70–140)
POTASSIUM: 3.9 meq/L (ref 3.5–5.1)
Sodium: 137 mEq/L (ref 136–145)
Total Bilirubin: 0.45 mg/dL (ref 0.20–1.20)
Total Protein: 6.1 g/dL — ABNORMAL LOW (ref 6.4–8.3)

## 2014-03-07 LAB — MAGNESIUM (CC13): MAGNESIUM: 1.7 mg/dL (ref 1.5–2.5)

## 2014-03-07 MED ORDER — DEXAMETHASONE SODIUM PHOSPHATE 10 MG/ML IJ SOLN
10.0000 mg | Freq: Once | INTRAMUSCULAR | Status: AC
  Administered 2014-03-07: 10 mg via INTRAVENOUS

## 2014-03-07 MED ORDER — DEXAMETHASONE SODIUM PHOSPHATE 10 MG/ML IJ SOLN
INTRAMUSCULAR | Status: AC
  Filled 2014-03-07: qty 1

## 2014-03-07 MED ORDER — SODIUM CHLORIDE 0.9 % IV SOLN
Freq: Once | INTRAVENOUS | Status: AC
  Administered 2014-03-07: 14:00:00 via INTRAVENOUS

## 2014-03-07 MED ORDER — ONDANSETRON 8 MG/50ML IVPB (CHCC)
8.0000 mg | Freq: Once | INTRAVENOUS | Status: AC
  Administered 2014-03-07: 8 mg via INTRAVENOUS

## 2014-03-07 MED ORDER — HEPARIN SOD (PORK) LOCK FLUSH 100 UNIT/ML IV SOLN
500.0000 [IU] | Freq: Once | INTRAVENOUS | Status: AC | PRN
  Administered 2014-03-07: 500 [IU]
  Filled 2014-03-07: qty 5

## 2014-03-07 MED ORDER — SODIUM CHLORIDE 0.9 % IV SOLN
158.4000 mg | Freq: Once | INTRAVENOUS | Status: AC
  Administered 2014-03-07: 160 mg via INTRAVENOUS
  Filled 2014-03-07: qty 16

## 2014-03-07 MED ORDER — SODIUM CHLORIDE 0.9 % IJ SOLN
10.0000 mL | INTRAMUSCULAR | Status: DC | PRN
  Administered 2014-03-07: 10 mL
  Filled 2014-03-07: qty 10

## 2014-03-07 MED ORDER — ONDANSETRON 8 MG/NS 50 ML IVPB
INTRAVENOUS | Status: AC
  Filled 2014-03-07: qty 8

## 2014-03-07 NOTE — Patient Instructions (Signed)
[facility] Discharge Instructions for Patients Receiving Chemotherapy  Today you received the following chemotherapy agents:  Carboplatin  To help prevent nausea and vomiting after your treatment, we encourage you to take your nausea medication as ordered per MD.   If you develop nausea and vomiting that is not controlled by your nausea medication, call the clinic.   BELOW ARE SYMPTOMS THAT SHOULD BE REPORTED IMMEDIATELY:  *FEVER GREATER THAN 100.5 F  *CHILLS WITH OR WITHOUT FEVER  NAUSEA AND VOMITING THAT IS NOT CONTROLLED WITH YOUR NAUSEA MEDICATION  *UNUSUAL SHORTNESS OF BREATH  *UNUSUAL BRUISING OR BLEEDING  TENDERNESS IN MOUTH AND THROAT WITH OR WITHOUT PRESENCE OF ULCERS  *URINARY PROBLEMS  *BOWEL PROBLEMS  UNUSUAL RASH Items with * indicate a potential emergency and should be followed up as soon as possible.  Feel free to call the clinic you have any questions or concerns. The clinic phone number is [phone].    

## 2014-03-07 NOTE — Progress Notes (Signed)
Met with patient during his 2nd chemo infusion to provide support.  Addressed his questions re: pre-chemo lab work, in-treatment/post-treatment evaluation of efficacy.  Continuing navigation as L1 patient (new patient).  Gayleen Orem, RN, BSN, [facility] Neck Oncology Navigator [phone]

## 2014-03-13 ENCOUNTER — Ambulatory Visit: Payer: Medicare Other | Admitting: Speech Pathology

## 2014-03-14 ENCOUNTER — Telehealth: Payer: Self-pay | Admitting: *Deleted

## 2014-03-14 ENCOUNTER — Ambulatory Visit (HOSPITAL_BASED_OUTPATIENT_CLINIC_OR_DEPARTMENT_OTHER): Payer: Medicare Other | Admitting: Hematology and Oncology

## 2014-03-14 ENCOUNTER — Telehealth: Payer: Self-pay | Admitting: Hematology and Oncology

## 2014-03-14 ENCOUNTER — Encounter: Payer: Self-pay | Admitting: Hematology and Oncology

## 2014-03-14 ENCOUNTER — Ambulatory Visit (HOSPITAL_BASED_OUTPATIENT_CLINIC_OR_DEPARTMENT_OTHER): Payer: Medicare Other

## 2014-03-14 ENCOUNTER — Other Ambulatory Visit (HOSPITAL_BASED_OUTPATIENT_CLINIC_OR_DEPARTMENT_OTHER): Payer: Medicare Other

## 2014-03-14 VITALS — BP 108/67 | HR 65 | Temp 97.6°F | Resp 18 | Ht 70.0 in | Wt 127.0 lb

## 2014-03-14 DIAGNOSIS — C44221 Squamous cell carcinoma of skin of unspecified ear and external auricular canal: Secondary | ICD-10-CM

## 2014-03-14 DIAGNOSIS — E441 Mild protein-calorie malnutrition: Secondary | ICD-10-CM | POA: Insufficient documentation

## 2014-03-14 DIAGNOSIS — Z5111 Encounter for antineoplastic chemotherapy: Secondary | ICD-10-CM

## 2014-03-14 DIAGNOSIS — C4490 Unspecified malignant neoplasm of skin, unspecified: Secondary | ICD-10-CM

## 2014-03-14 DIAGNOSIS — G519 Disorder of facial nerve, unspecified: Secondary | ICD-10-CM

## 2014-03-14 DIAGNOSIS — R1310 Dysphagia, unspecified: Secondary | ICD-10-CM

## 2014-03-14 DIAGNOSIS — K5900 Constipation, unspecified: Secondary | ICD-10-CM

## 2014-03-14 DIAGNOSIS — R110 Nausea: Secondary | ICD-10-CM

## 2014-03-14 DIAGNOSIS — D649 Anemia, unspecified: Secondary | ICD-10-CM

## 2014-03-14 DIAGNOSIS — G529 Cranial nerve disorder, unspecified: Secondary | ICD-10-CM

## 2014-03-14 DIAGNOSIS — D630 Anemia in neoplastic disease: Secondary | ICD-10-CM

## 2014-03-14 DIAGNOSIS — G510 Bell's palsy: Secondary | ICD-10-CM

## 2014-03-14 DIAGNOSIS — R634 Abnormal weight loss: Secondary | ICD-10-CM

## 2014-03-14 HISTORY — DX: Bell's palsy: G51.0

## 2014-03-14 HISTORY — DX: Mild protein-calorie malnutrition: E44.1

## 2014-03-14 HISTORY — DX: Anemia in neoplastic disease: D63.0

## 2014-03-14 LAB — COMPREHENSIVE METABOLIC PANEL (CC13)
ALT: 8 U/L (ref 0–55)
AST: 11 U/L (ref 5–34)
Albumin: 2.7 g/dL — ABNORMAL LOW (ref 3.5–5.0)
Alkaline Phosphatase: 66 U/L (ref 40–150)
Anion Gap: 8 mEq/L (ref 3–11)
BUN: 18.2 mg/dL (ref 7.0–26.0)
CALCIUM: 9.5 mg/dL (ref 8.4–10.4)
CHLORIDE: 106 meq/L (ref 98–109)
CO2: 25 meq/L (ref 22–29)
Creatinine: 0.8 mg/dL (ref 0.7–1.3)
GLUCOSE: 98 mg/dL (ref 70–140)
Potassium: 4.2 mEq/L (ref 3.5–5.1)
SODIUM: 139 meq/L (ref 136–145)
Total Bilirubin: 0.58 mg/dL (ref 0.20–1.20)
Total Protein: 6.3 g/dL — ABNORMAL LOW (ref 6.4–8.3)

## 2014-03-14 LAB — CBC WITH DIFFERENTIAL/PLATELET
BASO%: 0.2 % (ref 0.0–2.0)
BASOS ABS: 0 10*3/uL (ref 0.0–0.1)
EOS ABS: 0 10*3/uL (ref 0.0–0.5)
EOS%: 0.6 % (ref 0.0–7.0)
HEMATOCRIT: 27.1 % — AB (ref 38.4–49.9)
HEMOGLOBIN: 9.1 g/dL — AB (ref 13.0–17.1)
LYMPH#: 0.5 10*3/uL — AB (ref 0.9–3.3)
LYMPH%: 9.9 % — ABNORMAL LOW (ref 14.0–49.0)
MCH: 31.3 pg (ref 27.2–33.4)
MCHC: 33.6 g/dL (ref 32.0–36.0)
MCV: 93.1 fL (ref 79.3–98.0)
MONO#: 0.5 10*3/uL (ref 0.1–0.9)
MONO%: 8.6 % (ref 0.0–14.0)
NEUT%: 80.7 % — AB (ref 39.0–75.0)
NEUTROS ABS: 4.3 10*3/uL (ref 1.5–6.5)
Platelets: 155 10*3/uL (ref 140–400)
RBC: 2.91 10*6/uL — ABNORMAL LOW (ref 4.20–5.82)
RDW: 14.2 % (ref 11.0–14.6)
WBC: 5.4 10*3/uL (ref 4.0–10.3)
nRBC: 0 % (ref 0–0)

## 2014-03-14 LAB — MAGNESIUM (CC13): MAGNESIUM: 1.6 mg/dL (ref 1.5–2.5)

## 2014-03-14 MED ORDER — SODIUM CHLORIDE 0.9 % IV SOLN
Freq: Once | INTRAVENOUS | Status: AC
  Administered 2014-03-14: 15:00:00 via INTRAVENOUS

## 2014-03-14 MED ORDER — SODIUM CHLORIDE 0.9 % IJ SOLN
10.0000 mL | INTRAMUSCULAR | Status: DC | PRN
  Administered 2014-03-14: 10 mL
  Filled 2014-03-14: qty 10

## 2014-03-14 MED ORDER — DEXAMETHASONE SODIUM PHOSPHATE 10 MG/ML IJ SOLN
INTRAMUSCULAR | Status: AC
  Filled 2014-03-14: qty 1

## 2014-03-14 MED ORDER — ONDANSETRON 8 MG/50ML IVPB (CHCC)
8.0000 mg | Freq: Once | INTRAVENOUS | Status: AC
  Administered 2014-03-14: 8 mg via INTRAVENOUS

## 2014-03-14 MED ORDER — DEXAMETHASONE SODIUM PHOSPHATE 10 MG/ML IJ SOLN
10.0000 mg | Freq: Once | INTRAMUSCULAR | Status: AC
  Administered 2014-03-14: 10 mg via INTRAVENOUS

## 2014-03-14 MED ORDER — SODIUM CHLORIDE 0.9 % IV SOLN
158.4000 mg | Freq: Once | INTRAVENOUS | Status: AC
  Administered 2014-03-14: 160 mg via INTRAVENOUS
  Filled 2014-03-14: qty 16

## 2014-03-14 MED ORDER — ONDANSETRON 8 MG/NS 50 ML IVPB
INTRAVENOUS | Status: AC
  Filled 2014-03-14: qty 8

## 2014-03-14 MED ORDER — HEPARIN SOD (PORK) LOCK FLUSH 100 UNIT/ML IV SOLN
500.0000 [IU] | Freq: Once | INTRAVENOUS | Status: AC | PRN
Start: 2014-03-14 — End: 2014-03-14
  Administered 2014-03-14: 500 [IU]
  Filled 2014-03-14: qty 5

## 2014-03-14 NOTE — Progress Notes (Signed)
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, [id number]     Skin cancer   08/25/2012 Imaging PET Ct showed LN on the left side of neck   10/27/2012 - 12/15/2012 Radiation Therapy Patient underwent palliative radiation to the left neck for LN involvement, presumed metastatic squamous cell carcinoma from invasive skin cancer   05/10/2013 Procedure Biopsy of the mass near the right parotid confirmed squamous cell carcinoma   06/19/2013 - 07/31/2013 Radiation Therapy He underwent radiation to the right ear and mastoid region for recurrent cancer   09/14/2013 Surgery He underwent myringotomy and placement of tube to right inner ear   01/31/2014 Imaging Ct imaging showed extensive erosion of the cancer involving the right ear and mastoid bone. PET CTs scan also confirmed LN involvement on the right cervical LN    INTERVAL HISTORY: Cory Serrano 79 y.o. male returns for further followup. He tolerated chemotherapy well apart from mild nausea. Denies any vomiting. He complained of mild constipation, resolved with laxatives. He still had persistent difficulties with swallowing food, chewing food, hearing deficits as well as stinging sensation in his right eye. He loss more weight since I saw him. Denies any headaches.  I have reviewed the past medical history, past surgical history, social history and family history with the patient and they are unchanged from previous note.  ALLERGIES:  has No Known Allergies.  MEDICATIONS:  Current Outpatient Prescriptions  Medication Sig Dispense Refill  . aspirin 81 MG tablet Take 81 mg by mouth daily.      [name] atenolol (TENORMIN) 50 MG tablet Take 50 mg by mouth  daily with lunch.       . Coenzyme Q10 (CO Q 10) 100 MG CAPS Take 100 mg by mouth 2 (two) times daily.      [name] lidocaine-prilocaine (EMLA) cream Apply 1 application topically as needed.  30 g  0  . naproxen sodium (ANAPROX) 220 MG tablet Take 220 mg by mouth 2 (two) times daily with a meal.      . prochlorperazine (COMPAZINE) 10 MG tablet Take 1 tablet (10 mg total) by mouth every 6 (six) hours as needed (Nausea or vomiting).  30 tablet  1  . simvastatin (ZOCOR) 40 MG tablet Take 40 mg by mouth every evening.       . ondansetron (ZOFRAN) 8 MG tablet Take 1 tablet (8 mg total) by mouth every 8 (eight) hours as needed for nausea.  30 tablet  1   No current facility-administered medications for this visit.   Facility-Administered Medications Ordered in Other Visits  Medication Dose Route Frequency Provider Last Rate Last Dose  . sodium chloride 0.9 % injection 10 mL  10 mL Intravenous Once Blair Promise, MD        REVIEW OF SYSTEMS:   Constitutional: Denies fevers, chills  Ears, nose, mouth, throat, and face: Denies mucositis or sore throat Respiratory: Denies cough, dyspnea or wheezes Cardiovascular: Denies palpitation, chest discomfort or lower extremity swelling Skin: Denies abnormal skin rashes Lymphatics: Denies new lymphadenopathy or easy bruising Neurological:Denies numbness, tingling or new weaknesses Behavioral/Psych: Mood is stable, no new changes  All other systems were reviewed with the patient and are negative.  PHYSICAL EXAMINATION: ECOG  PERFORMANCE STATUS: 1 - Symptomatic but completely ambulatory  Filed Vitals:   03/14/14 1323  BP: 108/67  Pulse: 65  Temp: 97.6 F (36.4 C)  Resp: 18   Filed Weights   03/14/14 1323  Weight: 127 lb (57.607 kg)    GENERAL:alert, no distress and comfortable SKIN: The redness around his right external ear has improved. EYES: Persistent palsy on the right eye. OROPHARYNX: Very poor dentition. No thrush.  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 LYMPH: Palpable lymphadenopathy in the right external ear, unchanged compared to previous exam LUNGS: clear to auscultation and percussion with normal breathing effort HEART: regular rate & rhythm and no murmurs and no lower extremity edema ABDOMEN:abdomen soft, non-tender and normal bowel sounds Musculoskeletal:no cyanosis of digits and no clubbing  NEURO: alert & oriented x 3 with mild dysphasia, persistent right-sided facial nerve palsy LABORATORY DATA:  I have reviewed the data as listed    Component Value Date/Time   NA 137 03/07/2014 1250   NA 137 09/13/2013 1535   K 3.9 03/07/2014 1250   K 3.9 02/19/2014 1158   CL 103 09/13/2013 1535   CL 101 06/07/2013 0855   CO2 26 03/07/2014 1250   CO2 27 09/13/2013 1535   GLUCOSE 105 03/07/2014 1250   GLUCOSE 117* 09/13/2013 1535   GLUCOSE 82 06/07/2013 0855   BUN 16.5 03/07/2014 1250   BUN 14 09/13/2013 1535   CREATININE 0.7 03/07/2014 1250   CREATININE 1.00 01/31/2014 0844   CALCIUM 9.4 03/07/2014 1250   CALCIUM 9.3 09/13/2013 1535   PROT 6.1* 03/07/2014 1250   ALBUMIN 2.7* 03/07/2014 1250   AST 11 03/07/2014 1250   ALT 9 03/07/2014 1250   ALKPHOS 72 03/07/2014 1250   BILITOT 0.45 03/07/2014 1250   GFRNONAA 84* 09/13/2013 1535   GFRAA >90 09/13/2013 1535    No results found for this basename: SPEP,  UPEP,   kappa and lambda light chains    Lab Results  Component Value Date   WBC 5.4 03/14/2014   NEUTROABS 4.3 03/14/2014   HGB 9.1* 03/14/2014   HCT 27.1* 03/14/2014   MCV 93.1 03/14/2014   PLT 155 03/14/2014      Chemistry      Component Value Date/Time   NA 137 03/07/2014 1250   NA 137 09/13/2013 1535   K 3.9 03/07/2014 1250   K 3.9 02/19/2014 1158   CL 103 09/13/2013 1535   CL 101 06/07/2013 0855   CO2 26 03/07/2014 1250   CO2 27 09/13/2013 1535   BUN 16.5 03/07/2014 1250   BUN 14 09/13/2013 1535   CREATININE 0.7 03/07/2014 1250   CREATININE 1.00 01/31/2014 0844      Component Value Date/Time   CALCIUM 9.4  03/07/2014 1250   CALCIUM 9.3 09/13/2013 1535   ALKPHOS 72 03/07/2014 1250   AST 11 03/07/2014 1250   ALT 9 03/07/2014 1250   BILITOT 0.45 03/07/2014 1250      ASSESSMENT & PLAN:  #1 Invasive squamous cell carcinoma of the skin with destruction of the right external ear, erosion into the nearby bony structures and 7 th cranial nerve palsy Overall, he is improving on clinical examination. I recommend he continue weekly chemotherapy as scheduled. I plan to do minimal 8 weeks of carboplatin before repeat staging scans. #2 anemia This is likely due to recent treatment. The patient denies recent history of bleeding such as epistaxis, hematuria or hematochezia. He is asymptomatic from the anemia. I will observe  for now.  He does not require transfusion now. I will continue the chemotherapy at current dose without dosage adjustment.  If the anemia gets progressive worse in the future, I might have to delay his treatment or adjust the chemotherapy dose. #3 Weight loss and dysphagia He has been referred to speech therapist for speech and swallow assessment and nutritional consult for weight loss. Recommended patient to increase oral intake as tolerated #4 right cranial nerve palsy In order to protect his right eye, I recommend regular over the counter topical eye drops for lubrication and to wear and patch over the right eye to close it. He's doing well with the aggressive eye care. I recommend you defer his eye appointment with the ophthalmologist until next imaging study #5 mild nausea #6 mild constipation I recommend he takes antiemetics after each cycle of chemotherapy for a day or 2. He is instructed to take laxatives regularly to avoid constipation that may exacerbate his nausea.  All questions were answered. The patient knows to call the clinic with any problems, questions or concerns. No barriers to learning was detected. I spent 25 minutes counseling the patient face to face. The total time spent  in the appointment was 40 minutes and more than 50% was on counseling and review of test results     [facility], [place], MD 03/14/2014 2:07 PM

## 2014-03-14 NOTE — Telephone Encounter (Signed)
Per staff message and POF I have scheduled appts.  JMW  

## 2014-03-14 NOTE — Patient Instructions (Signed)
[facility] Discharge Instructions for Patients Receiving Chemotherapy  Today you received the following chemotherapy agents: carboplatin  To help prevent nausea and vomiting after your treatment, we encourage you to take your nausea medication.  Take it as often as prescribed.     If you develop nausea and vomiting that is not controlled by your nausea medication, call the clinic. If it is after clinic hours your family physician or the after hours number for the clinic or go to the Emergency Department.   BELOW ARE SYMPTOMS THAT SHOULD BE REPORTED IMMEDIATELY:  *FEVER GREATER THAN 100.5 F  *CHILLS WITH OR WITHOUT FEVER  NAUSEA AND VOMITING THAT IS NOT CONTROLLED WITH YOUR NAUSEA MEDICATION  *UNUSUAL SHORTNESS OF BREATH  *UNUSUAL BRUISING OR BLEEDING  TENDERNESS IN MOUTH AND THROAT WITH OR WITHOUT PRESENCE OF ULCERS  *URINARY PROBLEMS  *BOWEL PROBLEMS  UNUSUAL RASH Items with * indicate a potential emergency and should be followed up as soon as possible.  Feel free to call the clinic you have any questions or concerns. The clinic phone number is [phone].   I have been informed and understand all the instructions given to me. I know to contact the clinic, my physician, or go to the Emergency Department if any problems should occur. I do not have any questions at this time, but understand that I may call the clinic during office hours   should I have any questions or need assistance in obtaining follow up care.    __________________________________________  _____________  __________ Signature of Patient or Authorized Representative            Date                   Time    __________________________________________ Nurse's Signature    

## 2014-03-14 NOTE — Telephone Encounter (Signed)
Gave pt appt for lab,md and chemo for march and April 2015 °

## 2014-03-20 ENCOUNTER — Ambulatory Visit: Payer: Medicare Other

## 2014-03-21 ENCOUNTER — Other Ambulatory Visit (HOSPITAL_BASED_OUTPATIENT_CLINIC_OR_DEPARTMENT_OTHER): Payer: Medicare Other

## 2014-03-21 ENCOUNTER — Ambulatory Visit (HOSPITAL_BASED_OUTPATIENT_CLINIC_OR_DEPARTMENT_OTHER): Payer: Medicare Other

## 2014-03-21 ENCOUNTER — Other Ambulatory Visit: Payer: Self-pay | Admitting: Hematology and Oncology

## 2014-03-21 VITALS — BP 143/71 | HR 63 | Temp 97.8°F | Resp 16

## 2014-03-21 DIAGNOSIS — C4490 Unspecified malignant neoplasm of skin, unspecified: Secondary | ICD-10-CM

## 2014-03-21 DIAGNOSIS — Z5111 Encounter for antineoplastic chemotherapy: Secondary | ICD-10-CM

## 2014-03-21 DIAGNOSIS — C44221 Squamous cell carcinoma of skin of unspecified ear and external auricular canal: Secondary | ICD-10-CM

## 2014-03-21 DIAGNOSIS — D649 Anemia, unspecified: Secondary | ICD-10-CM

## 2014-03-21 LAB — COMPREHENSIVE METABOLIC PANEL (CC13)
ALBUMIN: 2.7 g/dL — AB (ref 3.5–5.0)
ALT: 8 U/L (ref 0–55)
ANION GAP: 11 meq/L (ref 3–11)
AST: 11 U/L (ref 5–34)
Alkaline Phosphatase: 70 U/L (ref 40–150)
BUN: 19.9 mg/dL (ref 7.0–26.0)
CALCIUM: 9.2 mg/dL (ref 8.4–10.4)
CHLORIDE: 104 meq/L (ref 98–109)
CO2: 23 meq/L (ref 22–29)
Creatinine: 0.8 mg/dL (ref 0.7–1.3)
Glucose: 96 mg/dl (ref 70–140)
POTASSIUM: 3.8 meq/L (ref 3.5–5.1)
SODIUM: 137 meq/L (ref 136–145)
TOTAL PROTEIN: 6.1 g/dL — AB (ref 6.4–8.3)
Total Bilirubin: 0.52 mg/dL (ref 0.20–1.20)

## 2014-03-21 LAB — CBC WITH DIFFERENTIAL/PLATELET
BASO%: 0.4 % (ref 0.0–2.0)
Basophils Absolute: 0 10*3/uL (ref 0.0–0.1)
EOS ABS: 0 10*3/uL (ref 0.0–0.5)
EOS%: 0.4 % (ref 0.0–7.0)
HCT: 26 % — ABNORMAL LOW (ref 38.4–49.9)
HGB: 8.9 g/dL — ABNORMAL LOW (ref 13.0–17.1)
LYMPH#: 0.5 10*3/uL — AB (ref 0.9–3.3)
LYMPH%: 9.2 % — AB (ref 14.0–49.0)
MCH: 31.3 pg (ref 27.2–33.4)
MCHC: 34.2 g/dL (ref 32.0–36.0)
MCV: 91.5 fL (ref 79.3–98.0)
MONO#: 0.6 10*3/uL (ref 0.1–0.9)
MONO%: 11.6 % (ref 0.0–14.0)
NEUT%: 78.4 % — ABNORMAL HIGH (ref 39.0–75.0)
NEUTROS ABS: 3.9 10*3/uL (ref 1.5–6.5)
PLATELETS: 120 10*3/uL — AB (ref 140–400)
RBC: 2.84 10*6/uL — AB (ref 4.20–5.82)
RDW: 14.7 % — AB (ref 11.0–14.6)
WBC: 5 10*3/uL (ref 4.0–10.3)
nRBC: 0 % (ref 0–0)

## 2014-03-21 LAB — MAGNESIUM (CC13): Magnesium: 1.5 mg/dl (ref 1.5–2.5)

## 2014-03-21 MED ORDER — SODIUM CHLORIDE 0.9 % IV SOLN
158.4000 mg | Freq: Once | INTRAVENOUS | Status: AC
  Administered 2014-03-21: 160 mg via INTRAVENOUS
  Filled 2014-03-21: qty 16

## 2014-03-21 MED ORDER — HEPARIN SOD (PORK) LOCK FLUSH 100 UNIT/ML IV SOLN
500.0000 [IU] | Freq: Once | INTRAVENOUS | Status: AC | PRN
  Administered 2014-03-21: 500 [IU]
  Filled 2014-03-21: qty 5

## 2014-03-21 MED ORDER — SODIUM CHLORIDE 0.9 % IJ SOLN
10.0000 mL | INTRAMUSCULAR | Status: DC | PRN
  Administered 2014-03-21: 10 mL
  Filled 2014-03-21: qty 10

## 2014-03-21 MED ORDER — SODIUM CHLORIDE 0.9 % IV SOLN
Freq: Once | INTRAVENOUS | Status: AC
  Administered 2014-03-21: 16:00:00 via INTRAVENOUS

## 2014-03-21 MED ORDER — DEXAMETHASONE SODIUM PHOSPHATE 10 MG/ML IJ SOLN
INTRAMUSCULAR | Status: AC
  Filled 2014-03-21: qty 1

## 2014-03-21 MED ORDER — ONDANSETRON 8 MG/NS 50 ML IVPB
INTRAVENOUS | Status: AC
  Filled 2014-03-21: qty 8

## 2014-03-21 MED ORDER — DEXAMETHASONE SODIUM PHOSPHATE 10 MG/ML IJ SOLN
10.0000 mg | Freq: Once | INTRAMUSCULAR | Status: AC
  Administered 2014-03-21: 10 mg via INTRAVENOUS

## 2014-03-21 MED ORDER — ONDANSETRON 8 MG/50ML IVPB (CHCC)
8.0000 mg | Freq: Once | INTRAVENOUS | Status: AC
  Administered 2014-03-21: 8 mg via INTRAVENOUS

## 2014-03-21 NOTE — Patient Instructions (Signed)
[place] Discharge Instructions for Patients Receiving Chemotherapy  Today you received the following chemotherapy agents carboplatin.  To help prevent nausea and vomiting after your treatment, we encourage you to take your nausea medication zofran or compazine.   If you develop nausea and vomiting that is not controlled by your nausea medication, call the clinic.   BELOW ARE SYMPTOMS THAT SHOULD BE REPORTED IMMEDIATELY:  *FEVER GREATER THAN 100.5 F  *CHILLS WITH OR WITHOUT FEVER  NAUSEA AND VOMITING THAT IS NOT CONTROLLED WITH YOUR NAUSEA MEDICATION  *UNUSUAL SHORTNESS OF BREATH  *UNUSUAL BRUISING OR BLEEDING  TENDERNESS IN MOUTH AND THROAT WITH OR WITHOUT PRESENCE OF ULCERS  *URINARY PROBLEMS  *BOWEL PROBLEMS  UNUSUAL RASH Items with * indicate a potential emergency and should be followed up as soon as possible.  Feel free to call the clinic you have any questions or concerns. The clinic phone number is [phone].

## 2014-03-22 ENCOUNTER — Encounter: Payer: Medicare Other | Admitting: Speech Pathology

## 2014-03-23 ENCOUNTER — Telehealth: Payer: Self-pay | Admitting: *Deleted

## 2014-03-23 ENCOUNTER — Other Ambulatory Visit: Payer: Self-pay | Admitting: Hematology and Oncology

## 2014-03-23 ENCOUNTER — Ambulatory Visit (HOSPITAL_BASED_OUTPATIENT_CLINIC_OR_DEPARTMENT_OTHER): Payer: Medicare Other

## 2014-03-23 DIAGNOSIS — R1310 Dysphagia, unspecified: Secondary | ICD-10-CM

## 2014-03-23 DIAGNOSIS — E860 Dehydration: Secondary | ICD-10-CM

## 2014-03-23 DIAGNOSIS — R110 Nausea: Secondary | ICD-10-CM

## 2014-03-23 DIAGNOSIS — R634 Abnormal weight loss: Secondary | ICD-10-CM

## 2014-03-23 MED ORDER — PROMETHAZINE HCL 25 MG/ML IJ SOLN
12.5000 mg | Freq: Once | INTRAMUSCULAR | Status: DC
  Administered 2014-03-23: 12.5 mg via INTRAVENOUS
  Filled 2014-03-23: qty 1

## 2014-03-23 MED ORDER — SODIUM CHLORIDE 0.9 % IV SOLN
Freq: Once | INTRAVENOUS | Status: DC
  Administered 2014-03-23: 12:00:00 via INTRAVENOUS

## 2014-03-23 NOTE — Patient Instructions (Addendum)
Dehydration, Adult Dehydration is when you lose more fluids from the body than you take in. Vital organs like the kidneys, brain, and heart cannot function without a proper amount of fluids and salt. Any loss of fluids from the body can cause dehydration.  CAUSES   Vomiting.  Diarrhea.  Excessive sweating.  Excessive urine output.  Fever. SYMPTOMS  Mild dehydration  Thirst.  Dry lips.  Slightly dry mouth. Moderate dehydration  Very dry mouth.  Sunken eyes.  Skin does not bounce back quickly when lightly pinched and released.  Dark urine and decreased urine production.  Decreased tear production.  Headache. Severe dehydration  Very dry mouth.  Extreme thirst.  Rapid, weak pulse (more than 100 beats per minute at rest).  Cold hands and feet.  Not able to sweat in spite of heat and temperature.  Rapid breathing.  Blue lips.  Confusion and lethargy.  Difficulty being awakened.  Minimal urine production.  No tears. DIAGNOSIS  Your caregiver will diagnose dehydration based on your symptoms and your exam. Blood and urine tests will help confirm the diagnosis. The diagnostic evaluation should also identify the cause of dehydration. TREATMENT  Treatment of mild or moderate dehydration can often be done at home by increasing the amount of fluids that you drink. It is best to drink small amounts of fluid more often. Drinking too much at one time can make vomiting worse. Refer to the home care instructions below. Severe dehydration needs to be treated at the hospital where you will probably be given intravenous (IV) fluids that contain water and electrolytes. HOME CARE INSTRUCTIONS   Ask your caregiver about specific rehydration instructions.  Drink enough fluids to keep your urine clear or pale yellow.  Drink small amounts frequently if you have nausea and vomiting.  Eat as you normally do.  Avoid:  Foods or drinks high in sugar.  Carbonated  drinks.  Juice.  Extremely hot or cold fluids.  Drinks with caffeine.  Fatty, greasy foods.  Alcohol.  Tobacco.  Overeating.  Gelatin desserts.  Wash your hands well to avoid spreading bacteria and viruses.  Only take over-the-counter or prescription medicines for pain, discomfort, or fever as directed by your caregiver.  Ask your caregiver if you should continue all prescribed and over-the-counter medicines.  Keep all follow-up appointments with your caregiver. SEEK MEDICAL CARE IF:  You have abdominal pain and it increases or stays in one area (localizes).  You have a rash, stiff neck, or severe headache.  You are irritable, sleepy, or difficult to awaken.  You are weak, dizzy, or extremely thirsty. SEEK IMMEDIATE MEDICAL CARE IF:   You are unable to keep fluids down or you get worse despite treatment.  You have frequent episodes of vomiting or diarrhea.  You have blood or green matter (bile) in your vomit.  You have blood in your stool or your stool looks black and tarry.  You have not urinated in 6 to 8 hours, or you have only urinated a small amount of very dark urine.  You have a fever.  You faint. MAKE SURE YOU:   Understand these instructions.  Will watch your condition.  Will get help right away if you are not doing well or get worse. Document Released: 12/14/2005 Document Revised: 03/07/2012 Document Reviewed: 08/03/2011 [facility] Patient Information 2014 [place], [place].   **Take Compazine three (3) times a day through the weekend. Call our office should symptoms increase or become worse. Per Dr. Alvy Bimler.

## 2014-03-23 NOTE — Telephone Encounter (Signed)
Per Gayleen Orem, RN.. Pt c/o dizziness.  Dr. Alvy Bimler ordered IVFs today.  Pt scheduled for 11:30 am in Infusion room.  Notified pt and he will be here at 11:30 am for IVFs.

## 2014-03-23 NOTE — Progress Notes (Signed)
Patient states he is still dizzy and doesn't feel much better than when he came in. Dr. Alvy Bimler notified.  [place] Dr. Alvy Bimler chairside. Patient may be discharged.

## 2014-03-23 NOTE — Progress Notes (Signed)
I evaluated the patient briefly at the infusion center. The patient complained of dizziness. He received one liter of IV fluids as well as 12.5 mg of IV Phenergan. His complained of persistent dizziness. No focal neurological weakness is detected. No signs and symptoms of infection are noted. I recommend he increase oral intake over the weekend and to take Compazine 10 mg on a schedule basis 3 times a day. I instructed the patient will call next Monday for updates.

## 2014-03-24 NOTE — Telephone Encounter (Signed)
Patient wife called, LVM stating patient feeling dizzy this morning.  Returned call and spoke with patient who stated he awoke yesterday feeling dizzy with ambulation, feeling continuing today.  He stated he has not been drinking as usual; out of Ensure, not drinking much water, but enjoying his coffee.  We discussed how caffeine can act as a diuretic, he acknowledge understanding.  I encouraged him to increase water intake; he stated he would.  I indicated I would relay his report to Dr. Alvy Bimler.  She directed me to call him and let him know that she was ordering fluids for him.  I notified him, told him he would get a call from Dr. Calton Dach nurse when he should arrive.  He verbalized understanding.  Continuing navigation as L1 patient (new patient).  Gayleen Orem, RN, BSN, [facility] Head & Neck Oncology Navigator [phone]

## 2014-03-26 ENCOUNTER — Other Ambulatory Visit: Payer: Self-pay | Admitting: Hematology and Oncology

## 2014-03-26 ENCOUNTER — Telehealth: Payer: Self-pay | Admitting: *Deleted

## 2014-03-26 ENCOUNTER — Ambulatory Visit (HOSPITAL_COMMUNITY)
Admission: RE | Admit: 2014-03-26 | Discharge: 2014-03-26 | Disposition: A | Discharge status: Home / Self Care | Payer: Medicare Other | Source: Ambulatory Visit | Attending: Hematology and Oncology | Admitting: Hematology and Oncology

## 2014-03-26 ENCOUNTER — Telehealth: Payer: Self-pay | Admitting: Hematology and Oncology

## 2014-03-26 ENCOUNTER — Ambulatory Visit (HOSPITAL_BASED_OUTPATIENT_CLINIC_OR_DEPARTMENT_OTHER): Payer: Medicare Other

## 2014-03-26 ENCOUNTER — Encounter: Payer: Self-pay | Admitting: Hematology and Oncology

## 2014-03-26 VITALS — BP 94/62 | HR 73 | Temp 97.0°F

## 2014-03-26 DIAGNOSIS — C490 Malignant neoplasm of connective and soft tissue of head, face and neck: Secondary | ICD-10-CM | POA: Insufficient documentation

## 2014-03-26 DIAGNOSIS — C4490 Unspecified malignant neoplasm of skin, unspecified: Secondary | ICD-10-CM

## 2014-03-26 DIAGNOSIS — I6529 Occlusion and stenosis of unspecified carotid artery: Secondary | ICD-10-CM | POA: Insufficient documentation

## 2014-03-26 DIAGNOSIS — E860 Dehydration: Secondary | ICD-10-CM

## 2014-03-26 DIAGNOSIS — C44221 Squamous cell carcinoma of skin of unspecified ear and external auricular canal: Secondary | ICD-10-CM

## 2014-03-26 DIAGNOSIS — R634 Abnormal weight loss: Secondary | ICD-10-CM

## 2014-03-26 DIAGNOSIS — R42 Dizziness and giddiness: Secondary | ICD-10-CM

## 2014-03-26 DIAGNOSIS — R110 Nausea: Secondary | ICD-10-CM

## 2014-03-26 DIAGNOSIS — R1310 Dysphagia, unspecified: Secondary | ICD-10-CM

## 2014-03-26 DIAGNOSIS — R262 Difficulty in walking, not elsewhere classified: Secondary | ICD-10-CM | POA: Insufficient documentation

## 2014-03-26 HISTORY — DX: Dizziness and giddiness: R42

## 2014-03-26 MED ORDER — IOHEXOL 300 MG/ML  SOLN
100.0000 mL | Freq: Once | INTRAMUSCULAR | Status: AC | PRN
  Administered 2014-03-26: 100 mL via INTRAVENOUS

## 2014-03-26 MED ORDER — SODIUM CHLORIDE 0.9 % IJ SOLN
10.0000 mL | INTRAMUSCULAR | Status: DC | PRN
  Administered 2014-03-26: 10 mL via INTRAVENOUS
  Filled 2014-03-26: qty 10

## 2014-03-26 MED ORDER — HEPARIN SOD (PORK) LOCK FLUSH 100 UNIT/ML IV SOLN
500.0000 [IU] | Freq: Once | INTRAVENOUS | Status: AC
  Administered 2014-03-26: 500 [IU] via INTRAVENOUS
  Filled 2014-03-26: qty 5

## 2014-03-26 MED ORDER — SODIUM CHLORIDE 0.9 % IV SOLN
Freq: Once | INTRAVENOUS | Status: AC
  Administered 2014-03-26: 13:00:00 via INTRAVENOUS

## 2014-03-26 MED ORDER — GADOBENATE DIMEGLUMINE 529 MG/ML IV SOLN
11.0000 mL | Freq: Once | INTRAVENOUS | Status: AC | PRN
  Administered 2014-03-26: 11 mL via INTRAVENOUS

## 2014-03-26 NOTE — Telephone Encounter (Signed)
Wife left VM this morning stating pt is not feeling any better, actually feeling worse w/ increased dizziness and wobbly on his feet.  He is not eating much at all although wife says she is pushing fluids and he is also taking compazine about three times daily.  Notified Dr. Alvy Bimler and she ordered IVFs today, tomorrow along w/ urgent CT neck and MRI brain then f/u w/ lab and office visit tomorrow afternoon.  Called wife and gave her appt time for IVFs today and other appts pending.  She verbalized understanding.  Called pt back and informed him of MRI/CT scheduled now for 4 pm.  Come in for IVFs and should be done before 4 pm to go to [facility] for scans.  He verbalized understanding.

## 2014-03-26 NOTE — Patient Instructions (Signed)
Dehydration, Adult Dehydration is when you lose more fluids from the body than you take in. Vital organs like the kidneys, brain, and heart cannot function without a proper amount of fluids and salt. Any loss of fluids from the body can cause dehydration.  CAUSES   Vomiting.  Diarrhea.  Excessive sweating.  Excessive urine output.  Fever. SYMPTOMS  Mild dehydration  Thirst.  Dry lips.  Slightly dry mouth. Moderate dehydration  Very dry mouth.  Sunken eyes.  Skin does not bounce back quickly when lightly pinched and released.  Dark urine and decreased urine production.  Decreased tear production.  Headache. Severe dehydration  Very dry mouth.  Extreme thirst.  Rapid, weak pulse (more than 100 beats per minute at rest).  Cold hands and feet.  Not able to sweat in spite of heat and temperature.  Rapid breathing.  Blue lips.  Confusion and lethargy.  Difficulty being awakened.  Minimal urine production.  No tears. DIAGNOSIS  Your caregiver will diagnose dehydration based on your symptoms and your exam. Blood and urine tests will help confirm the diagnosis. The diagnostic evaluation should also identify the cause of dehydration. TREATMENT  Treatment of mild or moderate dehydration can often be done at home by increasing the amount of fluids that you drink. It is best to drink small amounts of fluid more often. Drinking too much at one time can make vomiting worse. Refer to the home care instructions below. Severe dehydration needs to be treated at the hospital where you will probably be given intravenous (IV) fluids that contain water and electrolytes. HOME CARE INSTRUCTIONS   Ask your caregiver about specific rehydration instructions.  Drink enough fluids to keep your urine clear or pale yellow.  Drink small amounts frequently if you have nausea and vomiting.  Eat as you normally do.  Avoid:  Foods or drinks high in sugar.  Carbonated  drinks.  Juice.  Extremely hot or cold fluids.  Drinks with caffeine.  Fatty, greasy foods.  Alcohol.  Tobacco.  Overeating.  Gelatin desserts.  Wash your hands well to avoid spreading bacteria and viruses.  Only take over-the-counter or prescription medicines for pain, discomfort, or fever as directed by your caregiver.  Ask your caregiver if you should continue all prescribed and over-the-counter medicines.  Keep all follow-up appointments with your caregiver. SEEK MEDICAL CARE IF:  You have abdominal pain and it increases or stays in one area (localizes).  You have a rash, stiff neck, or severe headache.  You are irritable, sleepy, or difficult to awaken.  You are weak, dizzy, or extremely thirsty. SEEK IMMEDIATE MEDICAL CARE IF:   You are unable to keep fluids down or you get worse despite treatment.  You have frequent episodes of vomiting or diarrhea.  You have blood or green matter (bile) in your vomit.  You have blood in your stool or your stool looks black and tarry.  You have not urinated in 6 to 8 hours, or you have only urinated a small amount of very dark urine.  You have a fever.  You faint. MAKE SURE YOU:   Understand these instructions.  Will watch your condition.  Will get help right away if you are not doing well or get worse. Document Released: 12/14/2005 Document Revised: 03/07/2012 Document Reviewed: 08/03/2011 Exit[employer] Patient Information 2014 [employer].  

## 2014-03-26 NOTE — Telephone Encounter (Signed)
S/W PT RE SCANS TODAY @ [facility] 4PM - LINDA IN PREAUTH INFORMED. ALSO CONFIRMED APPT FOR IVF'S TODAY AND APPTS FOR T0MORROW. PT TO GET NEW SCHEDULE WHEN HE COMES IN.

## 2014-03-27 ENCOUNTER — Ambulatory Visit (HOSPITAL_BASED_OUTPATIENT_CLINIC_OR_DEPARTMENT_OTHER): Payer: Medicare Other | Admitting: Hematology and Oncology

## 2014-03-27 ENCOUNTER — Ambulatory Visit: Payer: Medicare Other

## 2014-03-27 ENCOUNTER — Encounter: Payer: Self-pay | Admitting: *Deleted

## 2014-03-27 ENCOUNTER — Other Ambulatory Visit: Payer: Self-pay | Admitting: *Deleted

## 2014-03-27 ENCOUNTER — Other Ambulatory Visit (HOSPITAL_BASED_OUTPATIENT_CLINIC_OR_DEPARTMENT_OTHER): Payer: Medicare Other

## 2014-03-27 VITALS — BP 106/67 | HR 68 | Temp 97.1°F | Resp 20 | Ht 70.0 in | Wt 123.6 lb

## 2014-03-27 DIAGNOSIS — C4490 Unspecified malignant neoplasm of skin, unspecified: Secondary | ICD-10-CM

## 2014-03-27 DIAGNOSIS — C44221 Squamous cell carcinoma of skin of unspecified ear and external auricular canal: Secondary | ICD-10-CM

## 2014-03-27 LAB — CBC WITH DIFFERENTIAL/PLATELET
BASO%: 0.2 % (ref 0.0–2.0)
Basophils Absolute: 0 10*3/uL (ref 0.0–0.1)
EOS ABS: 0 10*3/uL (ref 0.0–0.5)
EOS%: 0.4 % (ref 0.0–7.0)
HCT: 25.6 % — ABNORMAL LOW (ref 38.4–49.9)
HGB: 8.8 g/dL — ABNORMAL LOW (ref 13.0–17.1)
LYMPH%: 10.2 % — AB (ref 14.0–49.0)
MCH: 32 pg (ref 27.2–33.4)
MCHC: 34.4 g/dL (ref 32.0–36.0)
MCV: 93.1 fL (ref 79.3–98.0)
MONO#: 0.3 10*3/uL (ref 0.1–0.9)
MONO%: 5.4 % (ref 0.0–14.0)
NEUT%: 83.8 % — ABNORMAL HIGH (ref 39.0–75.0)
NEUTROS ABS: 4 10*3/uL (ref 1.5–6.5)
PLATELETS: 110 10*3/uL — AB (ref 140–400)
RBC: 2.75 10*6/uL — AB (ref 4.20–5.82)
RDW: 15.5 % — AB (ref 11.0–14.6)
WBC: 4.8 10*3/uL (ref 4.0–10.3)
lymph#: 0.5 10*3/uL — ABNORMAL LOW (ref 0.9–3.3)

## 2014-03-27 NOTE — Progress Notes (Signed)
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, [id number]     Skin cancer   08/25/2012 Imaging PET Ct showed LN on the left side of neck   10/27/2012 - 12/15/2012 Radiation Therapy Patient underwent palliative radiation to the left neck for LN involvement, presumed metastatic squamous cell carcinoma from invasive skin cancer   05/10/2013 Procedure Biopsy of the mass near the right parotid confirmed squamous cell carcinoma   06/19/2013 - 07/31/2013 Radiation Therapy He underwent radiation to the right ear and mastoid region for recurrent cancer   09/14/2013 Surgery He underwent myringotomy and placement of tube to right inner ear   01/31/2014 Imaging Ct imaging showed extensive erosion of the cancer involving the right ear and mastoid bone. PET CTs scan also confirmed LN involvement on the right cervical LN   02/21/2014 - 03/21/2014 Chemotherapy He received weekly carboplatin   03/26/2014 Imaging Repeat Ct neck showed disease progression    INTERVAL HISTORY: Cory Serrano 79 y.o. male returns for further follow-up. He complained of significant dizziness, not relieved by scheduled Compazine or IVF. No headaches. Denies nausea.  I have reviewed the past medical history, past surgical history, social history and family history with the patient and they are unchanged from previous note.  ALLERGIES:  has No Known Allergies.  MEDICATIONS:  Current Outpatient Prescriptions  Medication Sig Dispense Refill  . aspirin 81 MG tablet Take 81 mg by mouth daily.      [name] atenolol (TENORMIN) 50 MG tablet Take 50 mg by mouth daily with lunch.       . Coenzyme Q10 (CO Q 10) 100 MG CAPS Take 100 mg by mouth 2 (two) times daily.       [name] lidocaine-prilocaine (EMLA) cream Apply 1 application topically as needed.  30 g  0  . naproxen sodium (ANAPROX) 220 MG tablet Take 220 mg by mouth 2 (two) times daily with a meal.      . ondansetron (ZOFRAN) 8 MG tablet Take 1 tablet (8 mg total) by mouth every 8 (eight) hours as needed for nausea.  30 tablet  1  . prochlorperazine (COMPAZINE) 10 MG tablet Take 1 tablet (10 mg total) by mouth every 6 (six) hours as needed (Nausea or vomiting).  30 tablet  1  . simvastatin (ZOCOR) 40 MG tablet Take 40 mg by mouth every evening.        No current facility-administered medications for this visit.   Facility-Administered Medications Ordered in Other Visits  Medication Dose Route Frequency Provider Last Rate Last Dose  . sodium chloride 0.9 % injection 10 mL  10 mL Intravenous Once Blair Promise, MD        REVIEW OF SYSTEMS:   Constitutional: Denies fevers, chills or abnormal weight loss All other systems were reviewed with the patient and are negative.  PHYSICAL EXAMINATION: ECOG PERFORMANCE STATUS: 1 - Symptomatic but completely ambulatory  Filed Vitals:   03/27/14 1450  BP: 106/67  Pulse: 68  Temp: 97.1 F (36.2 C)  Resp: 20   Filed Weights   03/27/14 1450  Weight: 123 lb 9.6 oz (56.065 kg)    GENERAL:alert, no distress and comfortable SKIN: significant redness and destruction of right ear EYES: persistent ptosis of  right eye NEURO: alert & oriented x 3 with mild dysphasia with, focal right seventh cranial nerve palsy  LABORATORY DATA:  I have reviewed the data as listed    Component Value Date/Time   NA 137 03/21/2014 1528   NA 137 09/13/2013 1535   K 3.8 03/21/2014 1528   K 3.9 02/19/2014 1158   CL 103 09/13/2013 1535   CL 101 06/07/2013 0855   CO2 23 03/21/2014 1528   CO2 27 09/13/2013 1535   GLUCOSE 96 03/21/2014 1528   GLUCOSE 117* 09/13/2013 1535   GLUCOSE 82 06/07/2013 0855   BUN 19.9 03/21/2014 1528   BUN 14 09/13/2013 1535   CREATININE 0.8 03/21/2014 1528    CREATININE 1.00 01/31/2014 0844   CALCIUM 9.2 03/21/2014 1528   CALCIUM 9.3 09/13/2013 1535   PROT 6.1* 03/21/2014 1528   ALBUMIN 2.7* 03/21/2014 1528   AST 11 03/21/2014 1528   ALT 8 03/21/2014 1528   ALKPHOS 70 03/21/2014 1528   BILITOT 0.52 03/21/2014 1528   GFRNONAA 84* 09/13/2013 1535   GFRAA >90 09/13/2013 1535    No results found for this basename: SPEP, UPEP,  kappa and lambda light chains    Lab Results  Component Value Date   WBC 4.8 03/27/2014   NEUTROABS 4.0 03/27/2014   HGB 8.8* 03/27/2014   HCT 25.6* 03/27/2014   MCV 93.1 03/27/2014   PLT 110* 03/27/2014      Chemistry      Component Value Date/Time   NA 137 03/21/2014 1528   NA 137 09/13/2013 1535   K 3.8 03/21/2014 1528   K 3.9 02/19/2014 1158   CL 103 09/13/2013 1535   CL 101 06/07/2013 0855   CO2 23 03/21/2014 1528   CO2 27 09/13/2013 1535   BUN 19.9 03/21/2014 1528   BUN 14 09/13/2013 1535   CREATININE 0.8 03/21/2014 1528   CREATININE 1.00 01/31/2014 0844      Component Value Date/Time   CALCIUM 9.2 03/21/2014 1528   CALCIUM 9.3 09/13/2013 1535   ALKPHOS 70 03/21/2014 1528   AST 11 03/21/2014 1528   ALT 8 03/21/2014 1528   BILITOT 0.52 03/21/2014 1528       RADIOGRAPHIC STUDIES:I reviewed the scans with him and his wife I have personally reviewed the radiological images as listed and agreed with the findings in the report. Ct Soft Tissue Neck W Contrast  03/26/2014   CLINICAL DATA:  Invasive squamous cell carcinoma. Evaluate for response to treatment.  EXAM: CT NECK WITH CONTRAST  TECHNIQUE: Multidetector CT imaging of the neck was performed using the standard protocol following the bolus administration of intravenous contrast.  CONTRAST:  152mL OMNIPAQUE IOHEXOL 300 MG/ML  SOLN  COMPARISON:  01/31/2014 temporal bone CT and PET CT.  FINDINGS: Large area of squamous cell carcinoma involving the right external artery canal, pina and adjacent soft tissue. Destruction of adjacent right temporal bone and mastoid region is better  delineated and described on recent high-resolution temporal bone CT. This includes destruction of portions of the right jugular foramen and and posterior aspect of the right carotid canal with narrowing of the right internal jugular vein and very mild narrowing of the right internal carotid artery which remain patent.  It is difficult to make direct comparison to the recent PET-CT with regard to the primary malignancy. What can be stated with a fair degree of certainty is that the necrotic node posterior to the right parotid gland and inferior to the destroyed right mastoid  tip has increased in size now measuring 1.8 x 1.4 x 2 cm versus 0.9 x 0.8 x 1.2 cm on the recent PET-CT. This suggest progressive tumor.  Post therapy type changes with reticulation of fat planes diffusely throughout the head and neck region.  Atherosclerotic type changes of the carotid bifurcation with narrowing of the internal carotid artery more notable involving the proximal left internal carotid artery where there is a 64% diameter stenosis.  Right central line in place.  Tip not imaged on the present exam.  Prominent cervical spondylotic changes.  IMPRESSION: Large area of squamous cell carcinoma involving the right external artery canal, pina and adjacent soft tissue. Destruction of adjacent right temporal bone and mastoid region is better delineated and described on recent high-resolution temporal bone CT. This includes destruction of portions of the right jugular foramen and and posterior aspect of the right carotid canal with narrowing of the right internal jugular vein and very mild narrowing of the right internal carotid artery which remain patent.  It is difficult to make direct comparison to the recent PET-CT with regard to the primary malignancy. What can be stated with a fair degree of certainty is that the necrotic node posterior to the right parotid gland and inferior to the destroyed right mastoid tip has increased in size now  measuring 1.8 x 1.4 x 2 cm versus 0.9 x 0.8 x 1.2 cm on the recent PET-CT. This suggest progressive tumor.  Atherosclerotic type changes of the carotid bifurcation with narrowing of the internal carotid artery more notable involving the proximal left internal carotid artery where there is a 64% diameter stenosis.  This is a call report.   Electronically Signed   By: Chauncey Cruel M.D.   On: 03/26/2014 15:56   Mr Jeri Cos JJ Contrast  03/26/2014   CLINICAL DATA:  Multiple skin cancers. Difficulty walking. Balance disturbance.  EXAM: MRI HEAD WITHOUT AND WITH CONTRAST  TECHNIQUE: Multiplanar, multiecho pulse sequences of the brain and surrounding structures were obtained without and with intravenous contrast.  CONTRAST:  30mL MULTIHANCE GADOBENATE DIMEGLUMINE 529 MG/ML IV SOLN  COMPARISON:  Head CT same day  FINDINGS: Diffusion imaging does not show any acute or subacute infarction. There are mild chronic small-vessel changes of the pons. No cerebellar abnormality. The cerebral hemispheres show mild age related atrophy with mild chronic small-vessel change of the white matter, fairly typical for age. No cortical or large vessel territory infarction. No intra-axial mass lesion, hemorrhage, hydrocephalus or extra-axial collection. No evidence of metastatic disease to the brain. There is mild dural enhancement along the upper surface of the temporal bone on the right/tegmen. This probably relates to adjacent tumor in the temporal bone as described upon the recent CT. Abnormal fluid enhancement within the temporal bone an the soft tissues in the region of the right ear or completely described on that exam.  IMPRESSION: No acute finding relating to the brain itself. Ordinary age related chronic small vessel change as outlined above.  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 Dural enhancement adjacent to the right temporal bone without evidence of widespread or distant dural enhancement.  No evidence of metastatic  disease to the brain itself.   Electronically Signed   By: Nelson Chimes M.D.   On: 03/26/2014 17:15      ASSESSMENT & PLAN:  #1 Invasive skin cancer The patient has failed weekly carboplatin. We discussed the role of chemotherapy. The intent is for palliative. I estimated his chance of responding  to treatment is in the region of 20-25%. At most, the clinical benefits are likely to be partial response only; complete response in this situation is rare.  We discussed some of the risks, benefits, side-effects of Erbitux/cetuximab.  Some of the short term side-effects included, though not limited to, risk of fatigue, pancytopenia, life-threatening infections, allergic reactions, nausea, vomiting, sores in the mouth, changes in bowel habits especially diarrhea, admission to hospital for various reasons, and risks of death.   The patient is aware that the response rates discussed earlier is not guaranteed.    After a long discussion, patient made an informed decision to proceed with the prescribed plan of care and went ahead to sign the consent form today.   Patient education material was dispensed. He wants to think about it. I also discussed options with palliative care/hospice or referral to tertiary centers for possible clinical trial. I encouraged him and his wife to call me tomorrow with his plan. If he wants to proceed with chemo, I will start him on treatment this week and see him every other week. If he wants to proceed with hospice, we will discontinue treatment. I will cancel his scheduled IVF today   All questions were answered. The patient knows to call the clinic with any problems, questions or concerns. No barriers to learning was detected. I spent 40 minutes counseling the patient face to face. The total time spent in the appointment was 55 minutes and more than 50% was on counseling and review of test results     [facility], [place], MD 03/27/2014 9:23 PM

## 2014-03-28 ENCOUNTER — Encounter: Payer: Medicare Other | Admitting: Nutrition

## 2014-03-28 ENCOUNTER — Telehealth: Payer: Self-pay | Admitting: *Deleted

## 2014-03-28 ENCOUNTER — Other Ambulatory Visit: Payer: Medicare Other

## 2014-03-28 ENCOUNTER — Ambulatory Visit: Payer: Medicare Other

## 2014-03-28 NOTE — Telephone Encounter (Signed)
OK - thanks

## 2014-03-28 NOTE — Telephone Encounter (Signed)
Wife left VM states pt has been unable to make a decision about chemotherapy yet.  He wants to cancel appt today and take more time to think about it.  Either pt or wife will call us on Monday morning with a decision about treatment.   Notified Infusion room, Sharyn Lull, to cancel appt for today.

## 2014-03-29 ENCOUNTER — Other Ambulatory Visit: Payer: Self-pay | Admitting: Hematology and Oncology

## 2014-03-30 ENCOUNTER — Telehealth: Payer: Self-pay | Admitting: *Deleted

## 2014-03-30 ENCOUNTER — Telehealth: Payer: Self-pay | Admitting: Dietician

## 2014-03-30 NOTE — Telephone Encounter (Signed)
eroor

## 2014-04-03 ENCOUNTER — Telehealth: Payer: Self-pay | Admitting: *Deleted

## 2014-04-03 NOTE — Telephone Encounter (Signed)
Patient called re: appts scheduled for tomorrow.  He stated his decision to forgo additional chemotherapy and to pursue hospice with [facility].  I encouraged him to keep tomorrow's 11:15 appt with Dr. Alvy Bimler to share his thoughts with her.  He verbalized agreement.  He thanked me for general information I provided last week re: hospice services.  Gayleen Orem, RN, BSN, [facility] Head & Neck Oncology Navigator [phone]

## 2014-04-03 NOTE — Telephone Encounter (Signed)
Called patient to let him know Dr. Alvy Bimler did not need to see him and that she will make hospice referral for him.  He stated he had visited [facility] to gather information about services, is planning to do same with [facility].  He stated he will make his decision and call me re: which agency he wishes to have referral made.  I informed Dr. Alvy Bimler.   Gayleen Orem, RN, BSN, [facility] Head & Neck Oncology Navigator [phone]

## 2014-04-04 ENCOUNTER — Ambulatory Visit: Payer: Medicare Other

## 2014-04-04 ENCOUNTER — Other Ambulatory Visit: Payer: Medicare Other

## 2014-04-04 ENCOUNTER — Other Ambulatory Visit: Payer: Self-pay

## 2014-04-04 ENCOUNTER — Ambulatory Visit: Payer: Medicare Other | Admitting: Hematology and Oncology

## 2014-04-04 NOTE — Telephone Encounter (Signed)
Called patient in follow-up to his 03/27/14 office visit with Dr. Alvy Bimler.  Pt stated he has not decided if he wants to proceed with chemotherapy.  I answered his extensive questions about hospice including [facility].  I addressed his concerns about EOL pain, being a burden on his wife.  I assured him that hospice will meet his physical needs and provide the support both he and his wife will need.  I encouraged him to call [facility] to obtain more information.  He stated he plans to do so.  He thanked me for the information I provided and the support I'm providing.  Gayleen Orem, RN, BSN, [facility] Head & Neck Oncology Navigator [phone]

## 2014-04-04 NOTE — Progress Notes (Signed)
To provide support and encouragement, met with patient and his wife during appt with Dr. Alvy Bimler.  In context of discussed options of a new chemo regime or hospice, spent time with them discussing hospice, services they provide for both patient and family.  I addressed pt's expressed concerns about "what's it like at the end, I don't want to be in pain".  Wife's father had hospice, she spoke positively of the experience.  Wife verbalized to her husband "it's your decision, I will support however you decide".  I will follow-up with patient later in the week.  Gayleen Orem, RN, BSN, [facility] Head & Neck Oncology Navigator [phone]

## 2014-04-06 ENCOUNTER — Telehealth: Payer: Self-pay | Admitting: *Deleted

## 2014-04-09 ENCOUNTER — Telehealth: Payer: Self-pay | Admitting: *Deleted

## 2014-04-09 NOTE — Telephone Encounter (Signed)
Faxed referral form and records to [facility] to fax [phone].

## 2014-04-09 NOTE — Telephone Encounter (Signed)
[facility] to confirm they received referral.  They did and will see pt tomorrow at 2 pm.  They asked for orders for Hospice to provide symptom management orders.  Gave verbal order from Dr. Alvy Bimler.

## 2014-04-09 NOTE — Telephone Encounter (Signed)
Message copied by Cathlean Cower on Mon Apr 09, 2014  9:42 AM ------      Message from: Wynona Meals A      Created: Mon Apr 09, 2014  9:35 AM      Regarding: RE: Hospice Referral       Just spoke with Iona Beard.  He would like a referral to [facility].      Liliane Channel      ----- Message -----         From: Heath Lark, MD         Sent: 04/04/2014   2:11 PM           To: Brooks Sailors, RN, #      Subject: FW: Hospice Referral                                     Hi Cameo,            Please touch base with Liliane Channel and refer pt to his choice of hospice      Thanks      ----- Message -----         From: Brooks Sailors, RN         Sent: 04/04/2014   2:08 PM           To: Heath Lark, MD      Subject: RE: Hospice Referral                                     Yes.            ----- Message -----         From: Heath Lark, MD         Sent: 04/03/2014   7:40 PM           To: Brooks Sailors, RN      Subject: RE: Hospice Referral                                     Shall I cancel everything tomorrow?      ----- Message -----         From: Brooks Sailors, RN         Sent: 04/03/2014   5:07 PM           To: Heath Lark, MD      Subject: Hospice Referral                                         In follow-up to previous email.  Spoke with patient again and he stated he wants to talk to/visit [facility] before he decides whether he wants a referral to them or [facility].  He stated he will call me with his choice.      Thanks,      Liliane Channel                         ------

## 2014-04-09 NOTE — Telephone Encounter (Signed)
OK no problem.

## 2014-04-11 ENCOUNTER — Other Ambulatory Visit: Payer: Medicare Other

## 2014-04-11 ENCOUNTER — Ambulatory Visit: Payer: Medicare Other

## 2014-04-12 NOTE — Telephone Encounter (Signed)
Called patient to provide support.  He stated he had visited [place] [place], obtained information re: [facility].  He indicated he wanted to visit [facility] before making a decision about a referral.  He stated he would let me know next week.  Gayleen Orem, RN, BSN, [facility] Head & Neck Oncology Navigator [phone]

## 2014-04-12 NOTE — Telephone Encounter (Signed)
Patient called to let me know he wished to have a referral made to [facility].  He shared his thoughts about referring to hospice.  i provided support for his decision.  I relayed his request to Dr. Alvy Bimler.  Gayleen Orem, RN, BSN, [facility] Head & Neck Oncology Navigator [phone]

## 2014-04-18 ENCOUNTER — Telehealth: Payer: Self-pay | Admitting: *Deleted

## 2014-04-18 NOTE — Telephone Encounter (Signed)
Called patient to provide support as he proceeds with hospice services.  LVM.  Gayleen Orem, RN, BSN, [facility] Head & Neck Oncology Navigator [phone]

## 2014-04-19 ENCOUNTER — Telehealth: Payer: Self-pay | Admitting: *Deleted

## 2014-04-19 ENCOUNTER — Encounter: Payer: Self-pay | Admitting: *Deleted

## 2014-04-19 NOTE — Progress Notes (Signed)
Visited patient at [facility] to provide support.  He verbalized that while it was a quick decision to accept the bed offering, he was pleased that he did so.  It has allowed him to stop worrying about being a burden to his wife and to stop worrying about availability of immediate medical service should he need it.  He expressed appreciation for my visit.  Gayleen Orem, RN, BSN, [facility] Head & Neck Oncology Navigator [phone]

## 2014-04-19 NOTE — Telephone Encounter (Signed)
Received call from patient's wife who indicated that Cory Serrano move to [facility] yesterday.  He is alert but unable to swallow food or water d/t tumor growth.  She stated that this alteration in swallowing has been developing since his enrollment with Hospice 2 weeks ago.  She indicated Cory Serrano would appreciate seeing me.  I will visit him this afternoon.  Gayleen Orem, RN, BSN, [facility] Head & Neck Oncology Navigator [phone]

## 2014-05-01 ENCOUNTER — Telehealth: Payer: Self-pay | Admitting: *Deleted

## 2014-05-01 NOTE — Telephone Encounter (Signed)
VM from [place] at [facility] in [place].  She notifies that pt died yesterday.  Dr. Alvy Bimler is aware.

## 2014-05-14 ENCOUNTER — Telehealth: Payer: Self-pay | Admitting: *Deleted

## 2014-05-15 ENCOUNTER — Encounter: Payer: Self-pay | Admitting: Internal Medicine

## 2014-05-15 NOTE — Telephone Encounter (Signed)
Called patient's wife in follow-up to his death to provide additional support. She indicated she has been staying busy with settling legal affairs and sorting through his personal affects.  She, again, expressed appreciation for the support Danne received at [facility].  Gayleen Orem, RN, BSN, [facility] Head & Neck Oncology Navigator [phone]

## 2014-05-28 DEATH — deceased

## 2015-01-10 ENCOUNTER — Encounter (HOSPITAL_COMMUNITY): Payer: Self-pay | Admitting: Otolaryngology

## 2016-01-22 IMAGING — CT CT TEMPORAL BONES W/ CM
2 of 12 series · 14 of 40 positions shown, 16 images · IV contrast (omnipaque)
Comparison: PET-CT 01/31/2014 and earlier.  Brain MRI 04/07/2013.

ADDENDUM:
There is subtle asymmetric enlargement of foramen ovale on the right
(series 3, image 11) with ill-defined, somewhat masslike
hypoattenuation extending into the right pterygoid musculature
(series 3, image 6). These findings are concerning for possible
right V3 perineural tumor invasion.
CLINICAL DATA: Squamous cell carcinoma of the right ear. Decreased
right-sided hearing. Left neck mass.

EXAM:
CT TEMPORAL BONES WITH CONTRAST
TECHNIQUE: Axial and coronal plane CT imaging of the petrous temporal bones was
performed with thin-collimation image reconstruction after
intravenous contrast administration. Multiplanar CT image
reconstructions were also generated.
CONTRAST:  100mL OMNIPAQUE IOHEXOL 300 MG/ML  SOLN

[Series 2: temp bones bilat · axial · 0.33mm/px · z∈[+941,+979]mm · 7 of 86 slices shown, 9 images]
[im 11/86  brain]
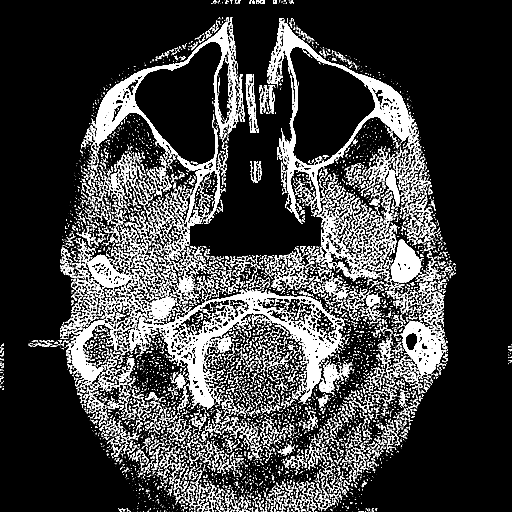
[im 11/86  bone]
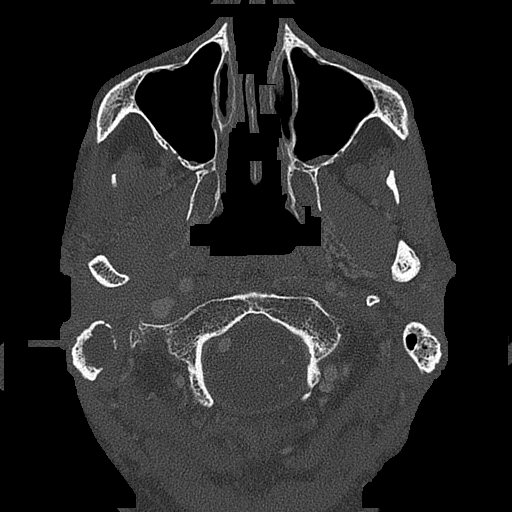
[im 22/86  bone]
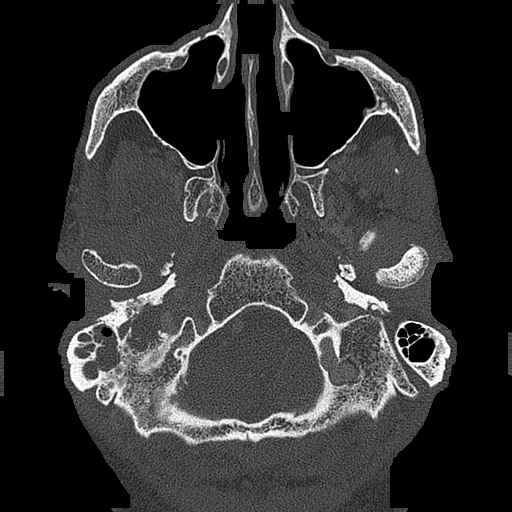
[im 32/86  bone]
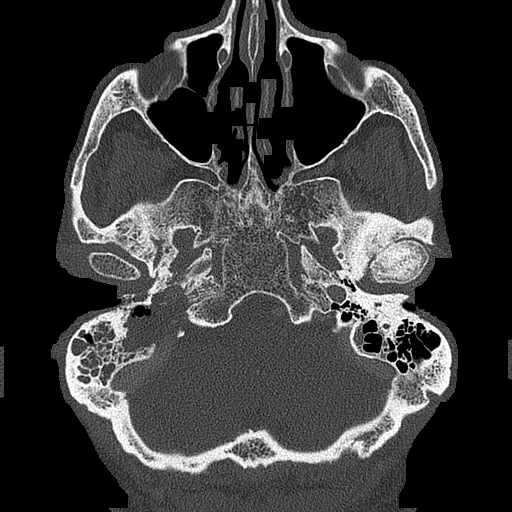
[im 43/86  bone]
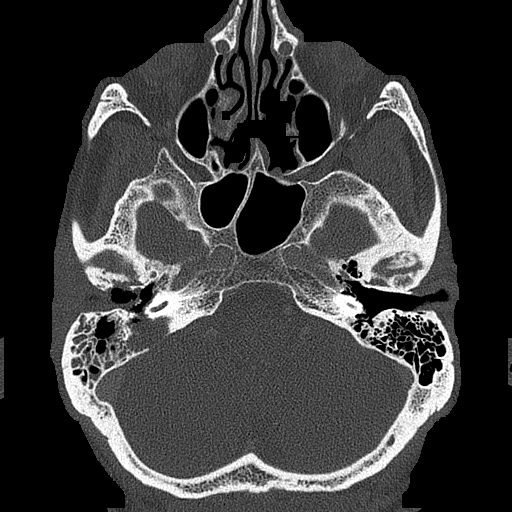
[im 54/86  brain]
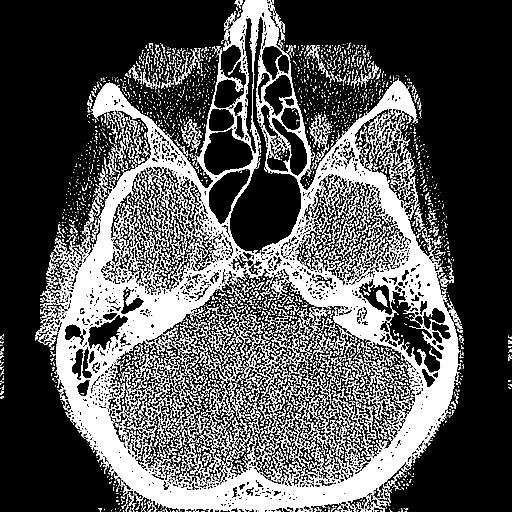
[im 54/86  bone]
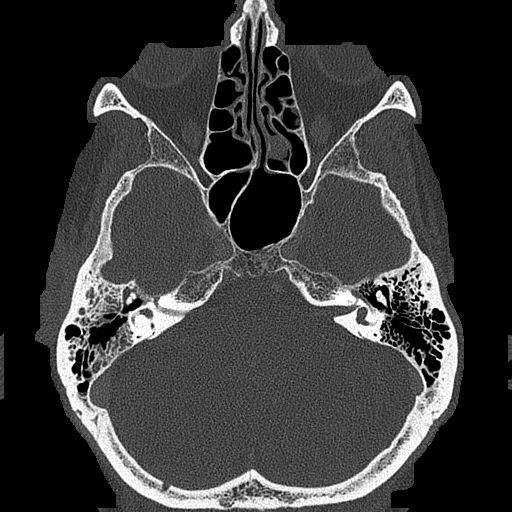
[im 64/86  bone]
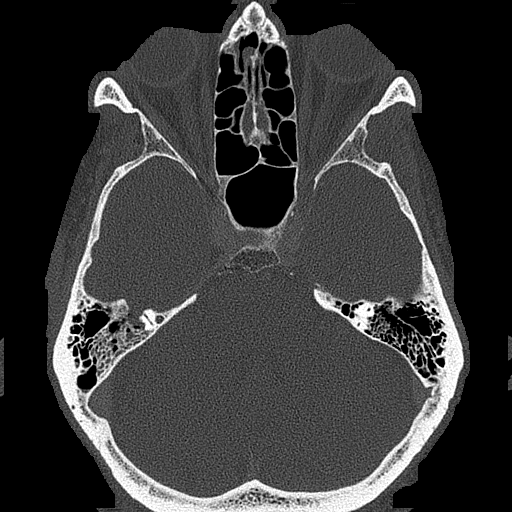
[im 75/86  bone]
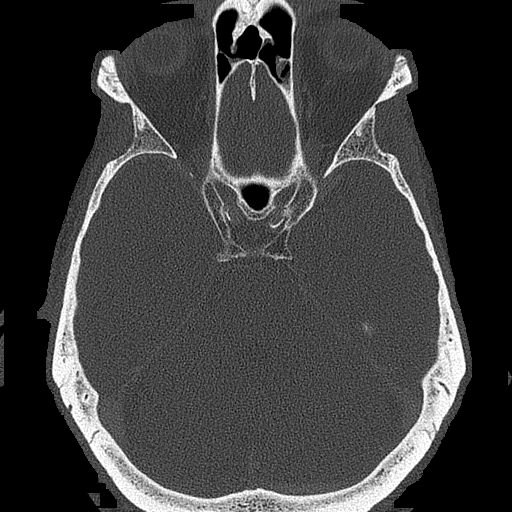

[Series 613: rt axial · axial · 0.18mm/px · z∈[+943,+978]mm · 7 of 79 slices shown]
[im 10/79  bone]
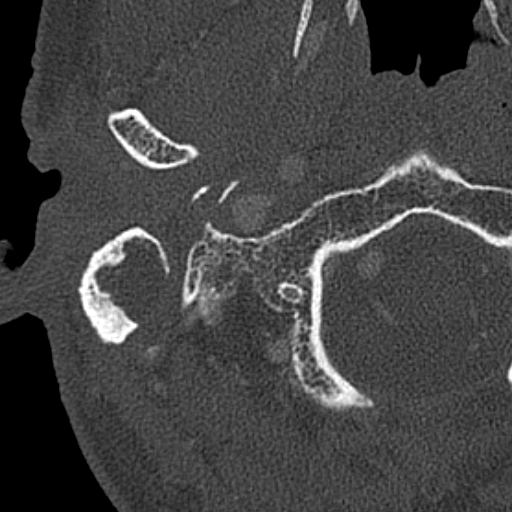
[im 20/79  bone]
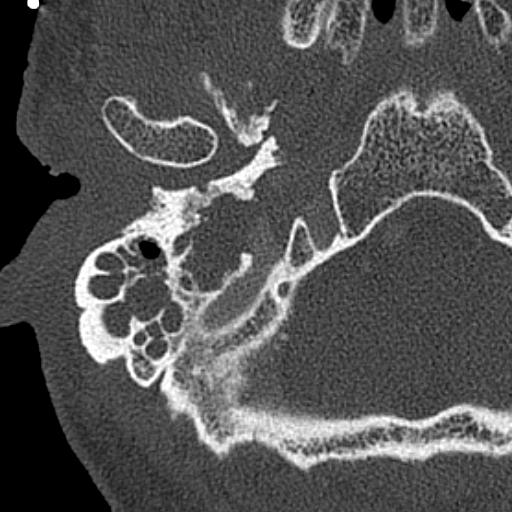
[im 30/79  bone]
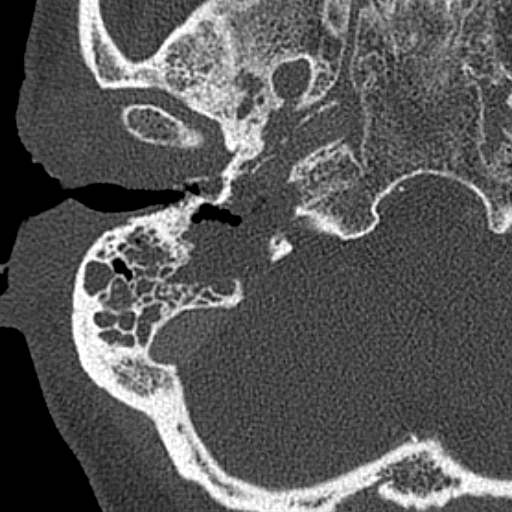
[im 40/79  bone]
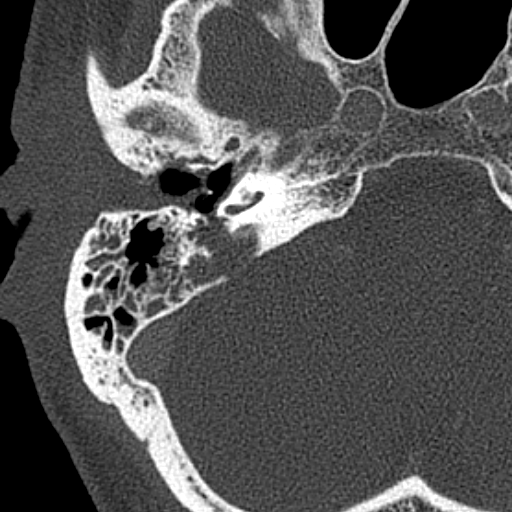
[im 49/79  bone]
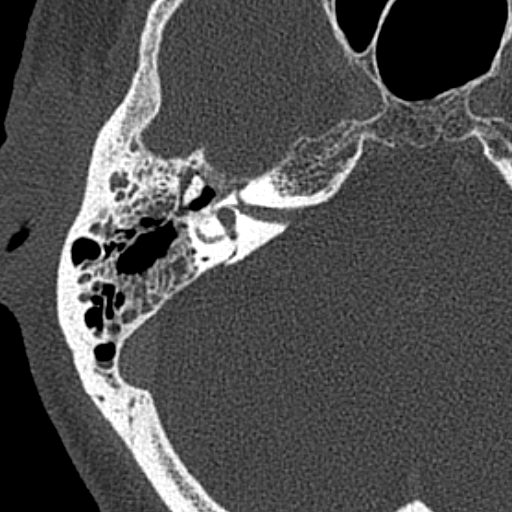
[im 59/79  bone]
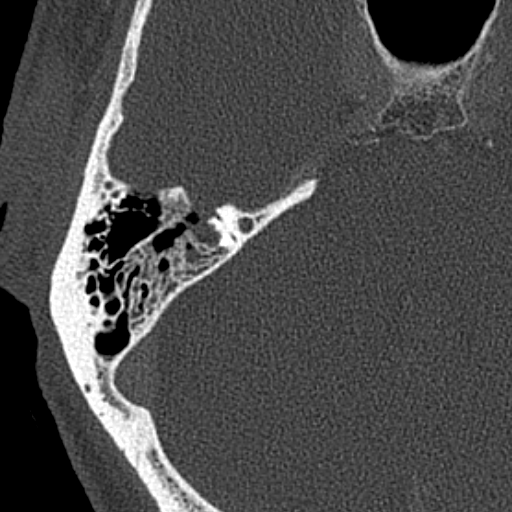
[im 69/79  bone]
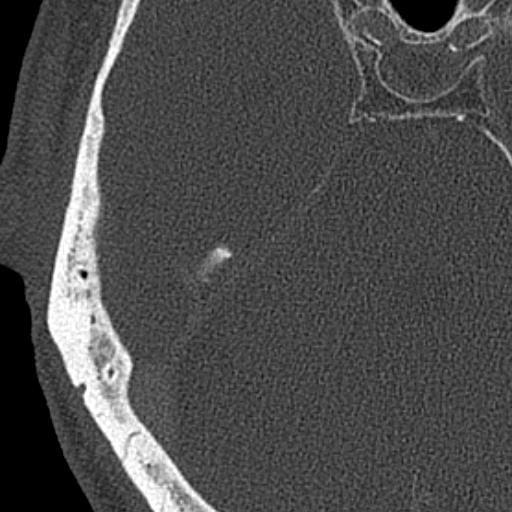

[14 of 40 positions shown; findings below may reference images not displayed]

FINDINGS: RIGHT: There is diffuse soft tissue thickening involving the
external auditory canal. Soft tissue thickening is also present
anterior and superior to the superficial portion of the EAC. There
is osseous erosion involving the anterior wall of the external
auditory canal. The tympanic membrane is thickened, and there is a
tympanostomy tube in place. There is a small to moderate amount of
fluid/ soft tissue within the tympanic cavity, predominantly in the
epitympanum and mesotympanum. The malleus and incus appear intact.
The stapes is not well visualized due to surrounding soft tissue.
There is dehiscence of the medial aspect of the tegmen mastoideum
and of the anterior aspect of the superior semicircular canal. The
internal auditory canal, cochlea, and vestibule are unremarkable.

There is partial opacification of multiple mastoid air cells
superiorly. Inferiorly, there is near complete mastoid air cell
opacification. There is extensive osseous erosion involving the
mastoid tip with complete osseous destruction medially. There is
marked osseous erosion of the medial mastoid portion of the temporal
bone/sigmoid plate and jugular foramen with multiple loculated gas
present in this region. There is also osseous erosion of the
posterior aspect of the petrous carotid canal. The visualized
internal carotid arteries and internal jugular veins appear patent.

LEFT: A small amount of cerumen or other soft tissue/debris is
present in the external auditory canal. The tympanic membrane,
tympanic cavity, internal auditory canal, cochlea, vestibule, and
semicircular canals are unremarkable. Ossicles appear intact. The
mastoid air cells are clear.

There is prominent sclerosis of the left sphenoid bone about the
temporomandibular joint with enlargement and irregularity of the
mandibular condyle and narrowing of the temporomandibular joint
space suggestive of advanced osteoarthrosis. The visualized portions
of the brain and orbits are unremarkable. Minimal mucosal thickening
is noted within the left ethmoid air cells and of the left maxillary
sinus.
IMPRESSION: 1. Diffuse soft tissue thickening involving the right external ear
structures and external auditory canal with osseous destruction of
the anterior wall of the EAC. This may reflect patient's known
cancer.
2. Marked osseous erosion of the right temporal bone, predominantly
involving the mastoid portion as above. There is erosion of the
tegmen mastoideum and of the superior semicircular canal. Extensive
erosion involves the sigmoid plate, jugular foramen and posterior
carotid canal as well as the mastoid tip.
